# Patient Record
Sex: Female | Born: 1985 | Race: White | Hispanic: No | Marital: Married | Smoking: Never smoker
Health system: Southern US, Community
[De-identification: ages and names within clinical notes are randomized; demographics above are authoritative.]

## PROBLEM LIST (undated history)

## (undated) DIAGNOSIS — R002 Palpitations: Secondary | ICD-10-CM

## (undated) DIAGNOSIS — F419 Anxiety disorder, unspecified: Secondary | ICD-10-CM

## (undated) DIAGNOSIS — F319 Bipolar disorder, unspecified: Secondary | ICD-10-CM

## (undated) DIAGNOSIS — Z8489 Family history of other specified conditions: Secondary | ICD-10-CM

## (undated) DIAGNOSIS — M1990 Unspecified osteoarthritis, unspecified site: Secondary | ICD-10-CM

## (undated) DIAGNOSIS — Q249 Congenital malformation of heart, unspecified: Secondary | ICD-10-CM

## (undated) DIAGNOSIS — F909 Attention-deficit hyperactivity disorder, unspecified type: Secondary | ICD-10-CM

## (undated) DIAGNOSIS — I471 Supraventricular tachycardia: Secondary | ICD-10-CM

## (undated) DIAGNOSIS — R41840 Attention and concentration deficit: Secondary | ICD-10-CM

## (undated) DIAGNOSIS — G4730 Sleep apnea, unspecified: Secondary | ICD-10-CM

## (undated) DIAGNOSIS — R112 Nausea with vomiting, unspecified: Secondary | ICD-10-CM

## (undated) DIAGNOSIS — M5136 Other intervertebral disc degeneration, lumbar region: Secondary | ICD-10-CM

## (undated) DIAGNOSIS — F32A Depression, unspecified: Secondary | ICD-10-CM

## (undated) DIAGNOSIS — R21 Rash and other nonspecific skin eruption: Secondary | ICD-10-CM

## (undated) DIAGNOSIS — K219 Gastro-esophageal reflux disease without esophagitis: Secondary | ICD-10-CM

## (undated) DIAGNOSIS — F329 Major depressive disorder, single episode, unspecified: Secondary | ICD-10-CM

## (undated) DIAGNOSIS — T7840XA Allergy, unspecified, initial encounter: Secondary | ICD-10-CM

## (undated) DIAGNOSIS — M51369 Other intervertebral disc degeneration, lumbar region without mention of lumbar back pain or lower extremity pain: Secondary | ICD-10-CM

## (undated) HISTORY — PX: GASTRIC BYPASS: SHX52

## (undated) HISTORY — DX: Anxiety disorder, unspecified: F41.9

## (undated) HISTORY — DX: Sleep apnea, unspecified: G47.30

## (undated) HISTORY — DX: Bipolar disorder, unspecified: F31.9

## (undated) HISTORY — DX: Allergy, unspecified, initial encounter: T78.40XA

## (undated) HISTORY — DX: Attention-deficit hyperactivity disorder, unspecified type: F90.9

## (undated) HISTORY — DX: Palpitations: R00.2

## (undated) HISTORY — DX: Depression, unspecified: F32.A

## (undated) HISTORY — DX: Attention and concentration deficit: R41.840

## (undated) HISTORY — DX: Supraventricular tachycardia: I47.1

## (undated) HISTORY — DX: Gastro-esophageal reflux disease without esophagitis: K21.9

## (undated) HISTORY — DX: Rash and other nonspecific skin eruption: R21

## (undated) HISTORY — DX: Nausea with vomiting, unspecified: R11.2

## (undated) HISTORY — DX: Congenital malformation of heart, unspecified: Q24.9

## (undated) SURGERY — LAPAROSCOPIC CHOLECYSTECTOMY
Anesthesia: General

---
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
## 2006-11-10 ENCOUNTER — Emergency Department: Payer: Self-pay

## 2007-03-09 ENCOUNTER — Other Ambulatory Visit: Payer: Self-pay

## 2007-03-09 ENCOUNTER — Observation Stay: Payer: Self-pay

## 2007-04-27 ENCOUNTER — Observation Stay: Payer: Self-pay

## 2007-05-07 ENCOUNTER — Inpatient Hospital Stay: Payer: Self-pay | Admitting: Obstetrics & Gynecology

## 2007-05-14 ENCOUNTER — Emergency Department: Payer: Self-pay | Admitting: Emergency Medicine

## 2007-05-18 ENCOUNTER — Encounter: Payer: Self-pay | Admitting: Internal Medicine

## 2007-05-23 ENCOUNTER — Encounter: Payer: Self-pay | Admitting: Internal Medicine

## 2011-02-22 ENCOUNTER — Emergency Department: Payer: Self-pay | Admitting: Emergency Medicine

## 2011-03-06 ENCOUNTER — Emergency Department: Payer: Self-pay | Admitting: Emergency Medicine

## 2011-10-30 ENCOUNTER — Ambulatory Visit: Payer: Self-pay | Admitting: Internal Medicine

## 2013-10-10 DIAGNOSIS — G4730 Sleep apnea, unspecified: Secondary | ICD-10-CM

## 2013-10-10 HISTORY — DX: Sleep apnea, unspecified: G47.30

## 2015-04-22 LAB — HM PAP SMEAR: HM Pap smear: NEGATIVE

## 2016-08-12 ENCOUNTER — Encounter: Payer: Self-pay | Admitting: Family

## 2016-08-12 ENCOUNTER — Ambulatory Visit (INDEPENDENT_AMBULATORY_CARE_PROVIDER_SITE_OTHER): Payer: 59 | Admitting: Family

## 2016-08-12 VITALS — BP 110/86 | HR 106 | Temp 98.1°F | Ht 65.0 in | Wt 148.6 lb

## 2016-08-12 DIAGNOSIS — Z7689 Persons encountering health services in other specified circumstances: Secondary | ICD-10-CM | POA: Diagnosis not present

## 2016-08-12 DIAGNOSIS — F319 Bipolar disorder, unspecified: Secondary | ICD-10-CM

## 2016-08-12 DIAGNOSIS — F411 Generalized anxiety disorder: Secondary | ICD-10-CM

## 2016-08-12 HISTORY — DX: Bipolar disorder, unspecified: F31.9

## 2016-08-12 MED ORDER — HYDROXYZINE HCL 25 MG PO TABS: 25.0000 mg | ORAL_TABLET | Freq: Every evening | ORAL | 0 refills | Status: DC | PRN

## 2016-08-12 NOTE — Assessment & Plan Note (Signed)
Reviewed past medical, family, and social history. Requesting records.

## 2016-08-12 NOTE — Progress Notes (Signed)
Subjective:    Patient ID: Madeline White, female    DOB: October 13, 1985, 30 y.o.   MRN: [id number]  CC: Madeline Salkicole Braud is a 30 y.o. female who presents today to establish care.    HPI: Patient presents as new patient. Prior care had been with [place]. Requesting records.  Here today as urged by friends to come in for anxiety.   Anxiety- over past 6 months, worsening. "worrying about everybody,' Trouble sleeping, hard to fall asleep and also waking up. Takes melatonin, Sleep Aid, Tyelonol PM with no relief. No thoughts of hurting herself or anyone else. Thinks there is someone outside of her house approx 3 nights out of the week and husband has to remind her no one is out there. No hallucinations. Describes being more irritable lately and difficultly concentrating.   H/o SVT- had [facility] at [facility] several years ago and had been on metoprolol which she took her self off of. ' I feel like it's a anxiety attack.' Denies exertional chest pain or pressure, numbness or tingling radiating to left arm or jaw, palpitations, dizziness, frequent headaches, changes in vision, or shortness of breath.    H/o gastric bypass- Roux en Y.  no further follow up after surgery. Had been 286 lbs prior.     HISTORY:  Past Medical History:  Diagnosis Date  . SVT (supraventricular tachycardia) (HCC) 2006   had been on metoprolol 10mg  but stopped taking   No past surgical history on file. No family history on file.  Allergies: Penicillins No current outpatient prescriptions on file prior to visit.   No current facility-administered medications on file prior to visit.     Social History  Substance Use Topics  . Smoking status: Never Smoker  . Smokeless tobacco: Never Used  . Alcohol use Yes    Review of Systems  Constitutional: Negative for chills, fever and unexpected weight change.  HENT: Negative for congestion.   Cardiovascular: Negative for chest pain and  palpitations.  Gastrointestinal: Negative for nausea and vomiting.  Musculoskeletal: Negative for arthralgias and myalgias.  Skin: Negative for rash.  Neurological: Negative for headaches.  Hematological: Negative for adenopathy.  Psychiatric/Behavioral: Positive for decreased concentration and sleep disturbance. Negative for confusion, hallucinations and suicidal ideas. The patient is nervous/anxious.       Objective:    BP 110/86   Pulse (!) 106   Temp 98.1 F (36.7 C) (Oral)   Wt 148 lb 9.6 oz (67.4 kg)   SpO2 98%  BP Readings from Last 3 Encounters:  08/12/16 110/86   Wt Readings from Last 3 Encounters:  08/12/16 148 lb 9.6 oz (67.4 kg)    Physical Exam  Constitutional: She appears well-developed and well-nourished.  Eyes: Conjunctivae are normal.  Cardiovascular: Normal rate, regular rhythm, normal heart sounds and normal pulses.   Pulmonary/Chest: Effort normal and breath sounds normal. She has no wheezes. She has no rhonchi. She has no rales.  Neurological: She is alert.  Skin: Skin is warm and dry.  Psychiatric: She has a normal mood and affect. Her speech is normal and behavior is normal. Thought content normal.  Vitals reviewed.      Assessment & Plan:   Problem List Items Addressed This Visit      Other   GAD (generalized anxiety disorder) - Primary    Worsening anxiety. Concern that patient may have bipolar depression and advised patient to see psychiatry for further evaluation as she may need mood stabilizing  medication. We agreed that counseling in our office was an appropriate next step. For the short term, patient will trial atarax 25mg  PRN for sleep and anxiety. Follow up in 2-4 weeks.       Relevant Medications   hydrOXYzine (ATARAX/VISTARIL) 25 MG tablet   Other Relevant Orders   Ambulatory referral to Psychology   Ambulatory referral to Psychiatry   Encounter to establish care    Reviewed past medical, family, and social history. Requesting  records.           I am having Ms. Moisan start on hydrOXYzine. I am also having her maintain her cholecalciferol, aspirin, cyanocobalamin, and Biotin.   Meds ordered this encounter  Medications  . cholecalciferol (VITAMIN D) 1000 units tablet    Sig: Take 2,000 Units by mouth daily.  Marland Kitchen. aspirin 81 MG chewable tablet    Sig: Chew by mouth daily.  . cyanocobalamin 2000 MCG tablet    Sig: Take 2,500 mcg by mouth daily.  . Biotin 1000 MCG tablet    Sig: Take 1,000 mcg by mouth 3 (three) times daily.  . hydrOXYzine (ATARAX/VISTARIL) 25 MG tablet    Sig: Take 1 tablet (25 mg total) by mouth at bedtime as needed for anxiety (insomnia). Take 30 to 60 minutes before bedtime.    Dispense:  30 tablet    Refill:  0    Order Specific Question:   Supervising Provider    Answer:   Sherlene ShamsULLO, TERESA L [[id number]]    Return precautions given.   Risks, benefits, and alternatives of the medications and treatment plan prescribed today were discussed, and patient expressed understanding.   Education regarding symptom management and diagnosis given to patient on AVS.  Continue to follow with Rennie PlowmanMargaret Cash Duce, FNP for routine health maintenance.   Madeline SalkNicole Cotugno and I agreed with plan.   Rennie PlowmanMargaret Vennela Jutte, FNP

## 2016-08-12 NOTE — Patient Instructions (Signed)
Trial atarax start 25 mg 30 minutes prior to bedtime.  May increase to 50mg  at bedtime if needed.   Follow up in 2 weeks.  Generalized Anxiety Disorder Generalized anxiety disorder (GAD) is a mental disorder. It interferes with life functions, including relationships, work, and school. GAD is different from normal anxiety, which everyone experiences at some point in their lives in response to specific life events and activities. Normal anxiety actually helps us prepare for and get through these life events and activities. Normal anxiety goes away after the event or activity is over.  GAD causes anxiety that is not necessarily related to specific events or activities. It also causes excess anxiety in proportion to specific events or activities. The anxiety associated with GAD is also difficult to control. GAD can vary from mild to severe. People with severe GAD can have intense waves of anxiety with physical symptoms (panic attacks).  SYMPTOMS The anxiety and worry associated with GAD are difficult to control. This anxiety and worry are related to many life events and activities and also occur more days than not for 6 months or longer. People with GAD also have three or more of the following symptoms (one or more in children):  Restlessness.   Fatigue.  Difficulty concentrating.   Irritability.  Muscle tension.  Difficulty sleeping or unsatisfying sleep. DIAGNOSIS GAD is diagnosed through an assessment by your health care provider. Your health care provider will ask you questions aboutyour mood,physical symptoms, and events in your life. Your health care provider may ask you about your medical history and use of alcohol or drugs, including prescription medicines. Your health care provider may also do a physical exam and blood tests. Certain medical conditions and the use of certain substances can cause symptoms similar to those associated with GAD. Your health care provider may refer you  to a mental health specialist for further evaluation. TREATMENT The following therapies are usually used to treat GAD:   Medication. Antidepressant medication usually is prescribed for long-term daily control. Antianxiety medicines may be added in severe cases, especially when panic attacks occur.   Talk therapy (psychotherapy). Certain types of talk therapy can be helpful in treating GAD by providing support, education, and guidance. A form of talk therapy called cognitive behavioral therapy can teach you healthy ways to think about and react to daily life events and activities.  Stress managementtechniques. These include yoga, meditation, and exercise and can be very helpful when they are practiced regularly. A mental health specialist can help determine which treatment is best for you. Some people see improvement with one therapy. However, other people require a combination of therapies. This information is not intended to replace advice given to you by your health care provider. Make sure you discuss any questions you have with your health care provider. Document Released: 01/02/2013 Document Revised: 09/28/2014 Document Reviewed: 01/02/2013 Elsevier Interactive Patient Education  2017 [employer]

## 2016-08-12 NOTE — Progress Notes (Signed)
Pre visit review using our clinic review tool, if applicable. No additional management support is needed unless otherwise documented below in the visit note. 

## 2016-08-12 NOTE — Assessment & Plan Note (Signed)
Worsening anxiety. Concern that patient may have bipolar depression and advised patient to see psychiatry for further evaluation as she may need mood stabilizing medication. We agreed that counseling in our office was an appropriate next step. For the short term, patient will trial atarax 25mg  PRN for sleep and anxiety. Follow up in 2-4 weeks.

## 2016-08-17 ENCOUNTER — Encounter: Payer: Self-pay | Admitting: Family

## 2016-08-17 NOTE — Progress Notes (Signed)
Patient is scheduling appointment.  Anxiety is still about the same. Patient will try to get pulse then call us back.

## 2016-08-17 NOTE — Progress Notes (Signed)
Added FM HX to record.

## 2016-08-24 ENCOUNTER — Encounter: Payer: Self-pay | Admitting: Family

## 2016-08-24 ENCOUNTER — Ambulatory Visit (INDEPENDENT_AMBULATORY_CARE_PROVIDER_SITE_OTHER): Payer: 59 | Admitting: Family

## 2016-08-24 ENCOUNTER — Encounter (HOSPITAL_COMMUNITY): Payer: Self-pay

## 2016-08-24 VITALS — BP 134/80 | HR 98 | Temp 98.0°F | Ht 65.0 in | Wt 149.8 lb

## 2016-08-24 DIAGNOSIS — F411 Generalized anxiety disorder: Secondary | ICD-10-CM | POA: Diagnosis not present

## 2016-08-24 DIAGNOSIS — R002 Palpitations: Secondary | ICD-10-CM | POA: Insufficient documentation

## 2016-08-24 HISTORY — DX: Palpitations: R00.2

## 2016-08-24 MED ORDER — HYDROXYZINE HCL 50 MG PO TABS: 25.0000 mg | ORAL_TABLET | Freq: Every evening | ORAL | 0 refills | Status: DC | PRN

## 2016-08-24 NOTE — Patient Instructions (Signed)
Let me work on earlier referral to psychiatry.  Referral to cardiology for possible Holter monitor due to palpitations.   Let me know if you need anything and happy happy holidays!

## 2016-08-24 NOTE — Assessment & Plan Note (Signed)
Stable. Still have concern for bipolar depression.  Slight improvement with sleep on atarax. Appt with counseling. Awaiting appt with psychiatry. Working to expedite and send to [place] if sooner appointment.

## 2016-08-24 NOTE — Progress Notes (Signed)
Subjective:    Patient ID: Madeline White, female    DOB: Jul 16, 1986, 30 y.o.   MRN: [id number]  CC: Madeline Salkicole Brasington is a 30 y.o. female who presents today for follow up.   HPI: GAD- Last visit trial of atarax 50 mg. Working some nights and the others 'not so much'. Has days 'only wanted to clean for days' and then days of not wanting to 'do anything' with difficulty motivating.  Has tried Zoloft and didn't work. No thoughts of hurting herself or anyone else.  Counseling scheduled [facility] 10/08/16.   H/o occasional palpitations. H/o of being on propranolol due to SVT. Denies exertional chest pain or pressure, numbness or tingling radiating to left arm or jaw,  dizziness, frequent headaches, changes in vision, or shortness of breath.        HISTORY:  Past Medical History:  Diagnosis Date  . Cardiac arrhythmia due to congenital heart disease   . GERD (gastroesophageal reflux disease)   . SVT (supraventricular tachycardia) (HCC) 2006   had been on metoprolol 10mg  but stopped taking   Past Surgical History:  Procedure Laterality Date  . CESAREAN SECTION    . GASTRIC BYPASS    . GASTRIC BYPASS     Family History  Problem Relation Age of Onset  . Hyperlipidemia Mother   . Diabetes Mother   . Hyperlipidemia Father   . Heart disease Maternal Grandmother   . Diabetes Maternal Grandmother   . Heart disease Maternal Grandfather   . Diabetes Maternal Grandfather   . Heart disease Paternal Grandmother   . Diabetes Paternal Grandmother   . Alcohol abuse Paternal Grandfather   . Heart disease Paternal Grandfather   . Diabetes Paternal Grandfather     Allergies: Penicillins Current Outpatient Prescriptions on File Prior to Visit  Medication Sig Dispense Refill  . aspirin 81 MG chewable tablet Chew by mouth daily.    . Biotin 1000 MCG tablet Take 1,000 mcg by mouth 3 (three) times daily.    . cholecalciferol (VITAMIN D) 1000 units tablet Take 2,000 Units by mouth daily.    .  cyanocobalamin 2000 MCG tablet Take 2,500 mcg by mouth daily.     No current facility-administered medications on file prior to visit.     Social History  Substance Use Topics  . Smoking status: Never Smoker  . Smokeless tobacco: Never Used  . Alcohol use Yes    Review of Systems  Constitutional: Negative for chills and fever.  Respiratory: Negative for cough.   Cardiovascular: Positive for palpitations. Negative for chest pain.  Gastrointestinal: Negative for nausea and vomiting.  Psychiatric/Behavioral: Positive for sleep disturbance. Negative for suicidal ideas. The patient is nervous/anxious.       Objective:    BP 134/80   Pulse 98   Temp 98 F (36.7 C) (Oral)   Ht 5\' 5"  (1.651 m)   Wt 149 lb 12.8 oz (67.9 kg)   SpO2 99%   BMI 24.93 kg/m  BP Readings from Last 3 Encounters:  08/24/16 134/80  08/12/16 110/86   Wt Readings from Last 3 Encounters:  08/24/16 149 lb 12.8 oz (67.9 kg)  08/12/16 148 lb 9.6 oz (67.4 kg)    Physical Exam  Constitutional: She appears well-developed and well-nourished.  Eyes: Conjunctivae are normal.  Cardiovascular: Normal rate, regular rhythm, normal heart sounds and normal pulses.   Pulmonary/Chest: Effort normal and breath sounds normal. She has no wheezes. She has no rhonchi. She has no rales.  Neurological: She  is alert.  Skin: Skin is warm and dry.  Psychiatric: She has a normal mood and affect. Her speech is normal and behavior is normal. Thought content normal.  Vitals reviewed.      Assessment & Plan:   Problem List Items Addressed This Visit      Other   GAD (generalized anxiety disorder)    Stable. Still have concern for bipolar depression.  Slight improvement with sleep on atarax. Appt with counseling. Awaiting appt with psychiatry. Working to expedite and send to [place] if sooner appointment.       Relevant Medications   hydrOXYzine (ATARAX/VISTARIL) 50 MG tablet   Palpitations - Primary    HR 98. Due to  history, patient and I agreed another evaluation would be appropriate, conservative next step since it has been several years since last Holter monitor.  Reassured by absence of acute chest pain, SOB. Return precautions given.        Relevant Orders   Ambulatory referral to Cardiology       I have changed [name] hydrOXYzine. I am also having her maintain her cholecalciferol, aspirin, cyanocobalamin, and Biotin.   Meds ordered this encounter  Medications  . hydrOXYzine (ATARAX/VISTARIL) 50 MG tablet    Sig: Take 0.5 tablets (25 mg total) by mouth at bedtime as needed for anxiety (insomnia). Take 30 to 60 minutes before bedtime.    Dispense:  90 tablet    Refill:  0    Order Specific Question:   Supervising Provider    Answer:   Sherlene ShamsULLO, TERESA L [[id number]]    Return precautions given.   Risks, benefits, and alternatives of the medications and treatment plan prescribed today were discussed, and patient expressed understanding.   Education regarding symptom management and diagnosis given to patient on AVS.  Continue to follow with Rennie PlowmanMargaret Arnett, FNP for routine health maintenance.   Madeline SalkNicole Quirino and I agreed with plan.   Rennie PlowmanMargaret Arnett, FNP

## 2016-08-24 NOTE — Progress Notes (Signed)
Pre visit review using our clinic review tool, if applicable. No additional management support is needed unless otherwise documented below in the visit note. 

## 2016-08-24 NOTE — Assessment & Plan Note (Signed)
HR 98. Due to history, patient and I agreed another evaluation would be appropriate, conservative next step since it has been several years since last Holter monitor.  Reassured by absence of acute chest pain, SOB. Return precautions given.

## 2016-08-25 ENCOUNTER — Other Ambulatory Visit: Payer: Self-pay | Admitting: Family

## 2016-08-25 ENCOUNTER — Telehealth: Payer: Self-pay | Admitting: Family

## 2016-08-25 DIAGNOSIS — F411 Generalized anxiety disorder: Secondary | ICD-10-CM

## 2016-08-25 MED ORDER — HYDROXYZINE HCL 50 MG PO TABS: 50.0000 mg | ORAL_TABLET | Freq: Every evening | ORAL | 0 refills | Status: DC | PRN

## 2016-08-25 NOTE — Telephone Encounter (Signed)
Refilled correctly 

## 2016-08-25 NOTE — Progress Notes (Signed)
It was submitted to [facility] on 11/22. They do have to review the notes prior to scheduling an appointment. I will follow up with them.

## 2016-08-26 ENCOUNTER — Ambulatory Visit: Payer: Self-pay | Admitting: Cardiology

## 2016-08-26 ENCOUNTER — Encounter: Payer: Self-pay | Admitting: *Deleted

## 2016-08-26 NOTE — Progress Notes (Signed)
a 

## 2016-08-26 NOTE — Progress Notes (Signed)
Submitted referral to [facility].

## 2016-08-27 ENCOUNTER — Telehealth: Payer: Self-pay | Admitting: Family

## 2016-08-27 ENCOUNTER — Encounter: Payer: Self-pay | Admitting: Family

## 2016-08-27 NOTE — Telephone Encounter (Signed)
Patient saw mychart message per patient.

## 2016-08-27 NOTE — Telephone Encounter (Signed)
Sent below mychart to pt if she calls - I couldn't leave Vm    I would like for you to go to [facility] services anytime between 8am-3pm.  They have counseling and psychiatrists there who can see you right away.  They have walk ins on Monday, Wednesdays, and Fridays.  The address is [address] [place], [place] [address].  Phone number is [phone].   They are near [facility] and [facility] [address].   Please know that I am thinking about you.

## 2016-09-07 ENCOUNTER — Encounter: Payer: Self-pay | Admitting: Psychiatry

## 2016-09-07 ENCOUNTER — Ambulatory Visit (INDEPENDENT_AMBULATORY_CARE_PROVIDER_SITE_OTHER): Payer: 59 | Admitting: Psychiatry

## 2016-09-07 VITALS — BP 118/78 | HR 98 | Temp 97.6°F | Ht 63.0 in | Wt 153.0 lb

## 2016-09-07 DIAGNOSIS — F411 Generalized anxiety disorder: Secondary | ICD-10-CM

## 2016-09-07 DIAGNOSIS — F3130 Bipolar disorder, current episode depressed, mild or moderate severity, unspecified: Secondary | ICD-10-CM | POA: Diagnosis not present

## 2016-09-07 MED ORDER — QUETIAPINE FUMARATE 25 MG PO TABS: 25.0000 mg | ORAL_TABLET | Freq: Two times a day (BID) | ORAL | 2 refills | Status: DC
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
## 2016-09-15 ENCOUNTER — Encounter: Payer: Self-pay | Admitting: Family

## 2016-09-28 ENCOUNTER — Encounter: Payer: Self-pay | Admitting: Family

## 2016-09-28 ENCOUNTER — Telehealth: Payer: Self-pay | Admitting: Psychiatry

## 2016-09-28 NOTE — Telephone Encounter (Signed)
Met with Dr. Adele Schilder at the [facility] office who reviewed patient's current medications and advised patient to try changing her Seroquel 25 mg all to the bedtime dosage, so 50 mg at night instead of twice a day to see if would help with sedation.  Patient to call back if not effective.

## 2016-09-28 NOTE — Telephone Encounter (Signed)
called patient and gave advise. but pt states she just can not do the seroquel.  she wants to try something else. even just doing 25mg  just made her sleepy all day and ill tempered.

## 2016-09-29 ENCOUNTER — Other Ambulatory Visit (HOSPITAL_COMMUNITY): Payer: Self-pay | Admitting: Psychiatry

## 2016-09-29 NOTE — Telephone Encounter (Signed)
Met with Dr. Adele Schilder again who reviewed patient's reported problems with Seroquel.  Dr. Adele Schilder authorized patient to try Abilify 2 mg, one a day, #30 with no refills and to discontinue the Seroquel but wants patient seen within a month when Dr. Einar Grad returns so patient needs an appointment upon Dr. Josefa Half return.  Patient to contact staff back if any problems with medication prior to that new appointment time.

## 2016-09-29 NOTE — Telephone Encounter (Signed)
called pt gave instructions to stop seroquel and that I was going to call in orders for abilify 2mg  take

## 2016-09-29 NOTE — Telephone Encounter (Signed)
called in rx for ability 2mg  #30 no additional refills.

## 2016-10-01 ENCOUNTER — Ambulatory Visit (INDEPENDENT_AMBULATORY_CARE_PROVIDER_SITE_OTHER): Payer: 59 | Admitting: Family

## 2016-10-01 ENCOUNTER — Encounter: Payer: Self-pay | Admitting: Family

## 2016-10-01 ENCOUNTER — Telehealth: Payer: Self-pay | Admitting: Family

## 2016-10-01 VITALS — BP 106/70 | HR 114 | Temp 98.3°F | Ht 65.0 in | Wt 151.2 lb

## 2016-10-01 DIAGNOSIS — M545 Low back pain, unspecified: Secondary | ICD-10-CM | POA: Insufficient documentation

## 2016-10-01 DIAGNOSIS — R002 Palpitations: Secondary | ICD-10-CM | POA: Diagnosis not present

## 2016-10-01 DIAGNOSIS — R21 Rash and other nonspecific skin eruption: Secondary | ICD-10-CM

## 2016-10-01 DIAGNOSIS — F3131 Bipolar disorder, current episode depressed, mild: Secondary | ICD-10-CM | POA: Diagnosis not present

## 2016-10-01 HISTORY — DX: Rash and other nonspecific skin eruption: R21

## 2016-10-01 MED ORDER — METOPROLOL SUCCINATE ER 25 MG PO TB24: 12.5000 mg | ORAL_TABLET | Freq: Every day | ORAL | 0 refills | Status: DC

## 2016-10-01 MED ORDER — CLOTRIMAZOLE 1 % EX CREA: 1.0000 "application " | TOPICAL_CREAM | Freq: Two times a day (BID) | CUTANEOUS | 1 refills | Status: DC

## 2016-10-01 MED ORDER — DICLOFENAC SODIUM 1 % TD GEL: 4.0000 g | Freq: Four times a day (QID) | TRANSDERMAL | 3 refills | Status: DC

## 2016-10-01 MED ORDER — CYCLOBENZAPRINE HCL 10 MG PO TABS: 10.0000 mg | ORAL_TABLET | Freq: Every evening | ORAL | 0 refills | Status: DC | PRN

## 2016-10-01 NOTE — Patient Instructions (Signed)
Trial flexeril at bedtime  voltaren gel for pain  Stop tramadol  Clotrimazole for rash  Exercises below.  walking program.   Over-the-counter medications you may try for back pain include:   ThermaCare patches   Capsaicin cream   Icy hot  Home exercises as we discussed below/handout.  If conservative treatment doesn't yield results, we will consider physical therapy, consult to Sports Medicine/Orthopedics for further evaluation, and imaging.   If there is no improvement in your symptoms, or if there is any worsening of symptoms, or if you have any additional concerns, please return for re-evaluation; or, if we are closed, consider going to the Emergency Room for evaluation if symptoms urgent.    Spondylolysis Rehab Ask your health care provider which exercises are safe for you. Do exercises exactly as told by your health care provider and adjust them as directed. It is normal to feel mild stretching, pulling, tightness, or discomfort as you do these exercises, but you should stop right away if you feel sudden pain or your pain gets worse. Do not begin these exercises until told by your health care provider. Stretching and range of motion exercises These exercises warm up your muscles and joints and improve the movement and flexibility of your hips and your back. These exercises may also help to relieve pain, numbness, and tingling. Exercise A: Single knee to chest 1. Lie on your back on a firm surface with both legs straight. 2. Bend one of your knees. Use your hands to move your knee up toward your chest until you feel a gentle stretch in your lower back and buttock.  Hold your leg in this position by holding onto the front of your knee.  Keep your other leg as straight as possible. 3. Hold for __________ seconds. 4. Slowly return to the starting position. 5. Repeat this exercise with your other leg. Repeat __________ times. Complete this exercise __________ times a  day. Exercise B: Hamstring stretch, supine 1. Lie on your back. 2. Hold both ends of a belt or towel as you loop it over the ball of one of your feet. The ball of your foot is on the walking surface, right under your toes. 3. Straighten your knee and slowly pull on the belt to raise your leg.  Do not let your knee bend while you do this.  Keep your other leg flat on the floor.  Raise the leg until you feel a gentle stretch in the back of your knee or thigh. 4. Hold for __________ seconds. 5. If told by your health care provider, repeat this exercise with your other leg. Repeat __________ times. Complete this exercise __________ times a day. Strengthening exercises These exercises build strength and endurance in your back. Endurance is the ability to use your muscles for a long time, even after they get tired. Exercise C: Pelvic tilt 1. Lie on your back on a firm bed or the floor. Bend your knees and keep your feet flat. 2. Tense your abdominal muscles. Tip your pelvis up toward the ceiling and flatten your lower back into the floor.  To help with this exercise, you may place a small towel under your lower back and try to push your back into the towel. 3. Hold for __________ seconds. 4. Let your muscles relax completely before you repeat this exercise. Repeat __________ times. Complete this exercise __________ times a day. Exercise D: Abdominal crunch 1. Lie on your back on a firm surface. Bend your knees and  keep your feet flat. Cross your arms over your chest. 2. Tuck your chin down toward your chest, without bending your neck. 3. Use your abdominal muscles to lift your upper body off of the ground, straight up into the air.  Try to lift yourself until your shoulder blades are off the ground. You may need to work up to this.  Keep your lower back on the ground while you crunch upward.  Do not hold your breath. 4. Slowly lower yourself down. Keep your abdominal muscles tense until  you are back to the starting position. Repeat __________ times. Complete this exercise __________ times a day. Exercise E: Alternating arm and leg raises 1. Get on your hands and knees on a firm surface. If you are on a hard floor, you may want to use padding to cushion your knees, such as an exercise mat. 2. Line up your arms and legs. Your hands should be below your shoulders, and your knees should be below your hips. 3. Lift your left leg behind you. At the same time, raise your right arm and straighten it in front of you.  Do not lift your leg higher than your hip.  Do not lift your arm higher than your shoulder.  Keep your abdominal and back muscles tight.  Keep your hips facing the ground.  Do not arch your back.  Keep your balance carefully, and do not hold your breath. 4. Hold for __________ seconds. 5. Slowly return to the starting position and repeat with your right leg and your left arm. Repeat __________ times. Complete this exercise __________ times a day. Posture and body mechanics Body mechanics refers to the movements and positions of your body while you do your daily activities. Posture is part of body mechanics. Good posture and healthy body mechanics can help to relieve stress in your body's tissues and joints. Good posture means that your spine is in its natural S-curve position (your spine is neutral), your shoulders are pulled back slightly, and your head is not tipped forward. The following are general guidelines for applying improved posture and body mechanics to your everyday activities. Standing   When standing, keep your spine neutral and your feet about hip-width apart. Keep a slight bend in your knees. Your ears, shoulders, and hips should line up with each other.  When you do a task in which you stand in one place for a long time, place one foot up on a stable object that is 2-4 inches (5-10 cm) high, such as a footstool. This helps keep your spine  neutral. Sitting  When sitting, keep your spine neutral and keep your feet flat on the floor. Use a footrest, if necessary, and keep your thighs parallel to the floor. Avoid rounding your shoulders, and avoid tilting your head forward.  When working at a desk or a computer, keep your desk at a height where your hands are slightly lower than your elbows. Slide your chair under your desk so you are close enough to maintain good posture.  When working at a computer, place your monitor at a height where you are looking straight ahead and you do not have to tilt your head forward or downward to look at the screen. Resting   When lying down and resting, avoid positions that are most painful for you.  If you have pain with activities such as sitting, bending, stooping, or squatting (flexion-based activities), lie in a position in which your body does not bend very much.  For example, avoid curling up on your side with your arms and knees near your chest (fetal position).  If you have pain with activities such as standing for a long time or reaching with your arms (extension-based activities), lie with your spine in a neutral position and bend your knees slightly. Try the following positions:  Lying on your side with a pillow between your knees.  Lying on your back with a pillow under your knees. Lifting   When lifting objects, keep your feet at least shoulder-width apart and tighten your abdominal muscles.  Bend your knees and hips and keep your spine neutral. It is important to lift using the strength of your legs, not your back. Do not lock your knees straight out.  Always ask for help to lift heavy or awkward objects. This information is not intended to replace advice given to you by your health care provider. Make sure you discuss any questions you have with your health care provider. Document Released: 09/07/2005 Document Revised: 05/14/2016 Document Reviewed: 06/18/2015 Elsevier  Interactive Patient Education  2017 [employer].

## 2016-10-01 NOTE — Progress Notes (Signed)
Pre visit review using our clinic review tool, if applicable. No additional management support is needed unless otherwise documented below in the visit note. 

## 2016-10-01 NOTE — Assessment & Plan Note (Signed)
Stable. Continues to follow with behavioral health.

## 2016-10-01 NOTE — Assessment & Plan Note (Signed)
No chest pain, shortness of breath. Chronic according to patient. I re-advised patient that I would like her to see cardiology for this history as she may need additional evaluation, echocardiogram, Holter monitor. In the interim, I went ahead and started metoprolol. Follow up in one month.

## 2016-10-01 NOTE — Assessment & Plan Note (Signed)
Symptoms consistent with candidiasis. Trial of antifungal.

## 2016-10-01 NOTE — Assessment & Plan Note (Signed)
Symptoms consistent with muscle strain, spasm. Treat short course of Flexeril at bedtime. Voltaren gel. Advised patient to stop tramadol which she is taking of her husband.I spent a great deal of time discussing the importance of exercise. I do not see tramadol as a part of medical therapy and emphasized this with patient.

## 2016-10-01 NOTE — Telephone Encounter (Signed)
Call pt and she how she is on on the metoprolol  Has she made an appt with cardiology yet?  What is her HR today?

## 2016-10-01 NOTE — Progress Notes (Signed)
Subjective:    Patient ID: Madeline White, female    DOB: 04-Sep-1986, 31 y.o.   MRN: [id number]  CC: Madeline White is a 31 y.o. female who presents today for an acute visit.    HPI: CC: low midline back pain 'forever'. A week ago had a flare and thought it was how she slept. 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 Thought from sleep or cleaning around the house. No numbness, tingling in legs, no LE weakness. Tried heating pad with some relief.   Taking husband's tramadol BID which helped  Recently changed from seroquel due to sleepiness, irritability to abilify. Follow up in one month.   Had appt with cardio however patient canceled it. She's been on metoprolol the past for heart palpitations and "not sure I shared him off of it".Denies exertional chest pain or pressure, numbness or tingling radiating to left arm or jaw, palpitations, dizziness, frequent headaches, changes in vision, or shortness of breath.   She also complains of red itchy rash inside her belly button that is foul-smelling. She has tried steroid cream with no resolved. No fever, discharge.       HISTORY:  Past Medical History:  Diagnosis Date  . Cardiac arrhythmia due to congenital heart disease   . GERD (gastroesophageal reflux disease)   . SVT (supraventricular tachycardia) (HCC) 2006   had been on metoprolol 10mg  but stopped taking   Past Surgical History:  Procedure Laterality Date  . CESAREAN SECTION    . GASTRIC BYPASS    . GASTRIC BYPASS     Family History  Problem Relation Age of Onset  . Hyperlipidemia Mother   . Diabetes Mother   . Hyperlipidemia Father   . Heart disease Maternal Grandmother   . Diabetes Maternal Grandmother   . Heart disease Maternal Grandfather   . Diabetes Maternal Grandfather   . Heart disease Paternal Grandmother   . Diabetes Paternal Grandmother   . Alcohol abuse Paternal Grandfather   . Heart disease Paternal Grandfather   . Diabetes Paternal Grandfather      Allergies: Penicillins Current Outpatient Prescriptions on File Prior to Visit  Medication Sig Dispense Refill  . aspirin 81 MG chewable tablet Chew by mouth daily.    . Biotin 1000 MCG tablet Take 1,000 mcg by mouth 3 (three) times daily.    . cholecalciferol (VITAMIN D) 1000 units tablet Take 2,000 Units by mouth daily.    . cyanocobalamin 2000 MCG tablet Take 2,500 mcg by mouth daily.     No current facility-administered medications on file prior to visit.     Social History  Substance Use Topics  . Smoking status: Never Smoker  . Smokeless tobacco: Never Used  . Alcohol use No    Review of Systems  Constitutional: Negative for chills and fever.  Respiratory: Negative for cough, shortness of breath and wheezing.   Cardiovascular: Positive for palpitations. Negative for chest pain.  Gastrointestinal: Negative for nausea and vomiting.  Skin: Positive for rash.  Psychiatric/Behavioral: The patient is nervous/anxious.       Objective:    BP 106/70   Pulse (!) 113   Temp 98.3 F (36.8 C) (Oral)   Ht 5\' 5"  (1.651 m)   Wt 151 lb 3.2 oz (68.6 kg)   SpO2 98%   BMI 25.16 kg/m    Physical Exam  Constitutional: She appears well-developed and well-nourished.  Eyes: Conjunctivae are normal.  Cardiovascular: Regular rhythm, normal heart sounds and normal pulses.  Tachycardia present.  No murmur heard. Pulmonary/Chest: Effort normal and breath sounds normal. She has no wheezes. She has no rhonchi. She has no rales.  Musculoskeletal:       Lumbar back: She exhibits normal range of motion, no tenderness, no bony tenderness, no swelling, no edema, no pain and no spasm.       Back:       Arms: Full range of motion with flexion, tension, lateral side bends. No bony tenderness. No pain, numbness, tingling elicited with single leg raise bilaterally. Pain as described by patient noted on diagram  Neurological: She is alert. She has normal strength. No sensory deficit.  Reflex  Scores:      Patellar reflexes are 2+ on the right side and 2+ on the left side. Sensation and strength intact bilateral lower extremities.  Skin: Skin is warm and dry. Rash noted.  Erythematous area noted inside umbilicus. No discharge.  Psychiatric: She has a normal mood and affect. Her speech is normal and behavior is normal. Thought content normal.  Vitals reviewed.      Assessment & Plan:   Problem List Items Addressed This Visit      Musculoskeletal and Integument   Rash and nonspecific skin eruption - Primary    Symptoms consistent with candidiasis. Trial of antifungal.      Relevant Medications   clotrimazole (LOTRIMIN) 1 % cream     Other   Bipolar affective (HCC)    Stable. Continues to follow with behavioral health.      Palpitations    No chest pain, shortness of breath. Chronic according to patient. I re-advised patient that I would like her to see cardiology for this history as she may need additional evaluation, echocardiogram, Holter monitor. In the interim, I went ahead and started metoprolol. Follow up in one month.       Relevant Medications   metoprolol succinate (TOPROL XL) 25 MG 24 hr tablet   Midline low back pain without sciatica    Symptoms consistent with muscle strain, spasm. Treat short course of Flexeril at bedtime. Voltaren gel. Advised patient to stop tramadol which she is taking of her husband.I spent a great deal of time discussing the importance of exercise. I do not see tramadol as a part of medical therapy and emphasized this with patient.       Relevant Medications   cyclobenzaprine (FLEXERIL) 10 MG tablet   diclofenac sodium (VOLTAREN) 1 % GEL        I have discontinued Ms. Hlavac QUEtiapine. I am also having her start on metoprolol succinate, clotrimazole, cyclobenzaprine, and diclofenac sodium. Additionally, I am having her maintain her ARIPiprazole, cholecalciferol, aspirin, cyanocobalamin, and Biotin.   Meds ordered this  encounter  Medications  . ARIPiprazole (ABILIFY) 2 MG tablet    Sig: Take 2 mg by mouth daily.  . metoprolol succinate (TOPROL XL) 25 MG 24 hr tablet    Sig: Take 0.5 tablets (12.5 mg total) by mouth daily.    Dispense:  90 tablet    Refill:  0    Order Specific Question:   Supervising Provider    Answer:   Duncan Dull L [[id number]]  . clotrimazole (LOTRIMIN) 1 % cream    Sig: Apply 1 application topically 2 (two) times daily.    Dispense:  30 g    Refill:  1    Order Specific Question:   Supervising Provider    Answer:   Darrick Huntsman, TERESA L [[id number]]  . cyclobenzaprine (FLEXERIL) 10 MG  tablet    Sig: Take 1 tablet (10 mg total) by mouth at bedtime as needed for muscle spasms.    Dispense:  14 tablet    Refill:  0    Order Specific Question:   Supervising Provider    Answer:   Duncan Dull L [[id number]]  . diclofenac sodium (VOLTAREN) 1 % GEL    Sig: Apply 4 g topically 4 (four) times daily.    Dispense:  1 Tube    Refill:  3    Order Specific Question:   Supervising Provider    Answer:   Sherlene Shams [[id number]]    Return precautions given.   Risks, benefits, and alternatives of the medications and treatment plan prescribed today were discussed, and patient expressed understanding.   Education regarding symptom management and diagnosis given to patient on AVS.  Continue to follow with Rennie Plowman, FNP for routine health maintenance.   Madeline Salk and I agreed with plan.   Rennie Plowman, FNP

## 2016-10-05 ENCOUNTER — Telehealth: Payer: Self-pay

## 2016-10-05 ENCOUNTER — Encounter: Payer: Self-pay | Admitting: Family

## 2016-10-05 NOTE — Telephone Encounter (Signed)
pt called states she can not take the abilify either it is making her sleepy, ill and easy to anger.  [place] abilify. since dr. Daleen Boravi was out of the country.

## 2016-10-05 NOTE — Telephone Encounter (Signed)
Spoken to patient unsure of heart rate.  She feels no difference on the metoprolol.  She has cardio appointment tomorrow. Also patient has scheduled FU with margaret this week.

## 2016-10-06 ENCOUNTER — Encounter: Payer: Self-pay | Admitting: Family

## 2016-10-06 ENCOUNTER — Ambulatory Visit (INDEPENDENT_AMBULATORY_CARE_PROVIDER_SITE_OTHER): Payer: 59 | Admitting: Family

## 2016-10-06 ENCOUNTER — Telehealth: Payer: Self-pay

## 2016-10-06 ENCOUNTER — Telehealth (HOSPITAL_COMMUNITY): Payer: Self-pay

## 2016-10-06 ENCOUNTER — Encounter: Payer: Self-pay | Admitting: Cardiology

## 2016-10-06 ENCOUNTER — Ambulatory Visit (INDEPENDENT_AMBULATORY_CARE_PROVIDER_SITE_OTHER): Payer: 59 | Admitting: Cardiology

## 2016-10-06 VITALS — BP 120/75 | HR 97 | Temp 98.0°F | Ht 65.0 in | Wt 151.6 lb

## 2016-10-06 VITALS — BP 120/80 | HR 75 | Ht 66.0 in | Wt 151.0 lb

## 2016-10-06 DIAGNOSIS — Z8679 Personal history of other diseases of the circulatory system: Secondary | ICD-10-CM

## 2016-10-06 DIAGNOSIS — M545 Low back pain, unspecified: Secondary | ICD-10-CM

## 2016-10-06 DIAGNOSIS — R002 Palpitations: Secondary | ICD-10-CM | POA: Diagnosis not present

## 2016-10-06 DIAGNOSIS — F3131 Bipolar disorder, current episode depressed, mild: Secondary | ICD-10-CM | POA: Diagnosis not present

## 2016-10-06 MED ORDER — DULOXETINE HCL 30 MG PO CPEP: ORAL_CAPSULE | ORAL | 3 refills | Status: DC

## 2016-10-06 MED ORDER — TRAMADOL HCL 50 MG PO TABS: 50.0000 mg | ORAL_TABLET | Freq: Every day | ORAL | 0 refills | Status: DC | PRN

## 2016-10-06 MED ORDER — TRAMADOL HCL 50 MG PO TABS: 25.0000 mg | ORAL_TABLET | Freq: Every day | ORAL | 0 refills | Status: DC | PRN

## 2016-10-06 NOTE — Patient Instructions (Signed)
Trial of cymbalta for anxiety and pain.  Watch your blood pressure and heart rate on cymbalta.    Consider counseling for irritability.    If there is no improvement in your symptoms, or if there is any worsening of symptoms, or if you have any additional concerns, please return for re-evaluation; or, if we are closed, consider going to the Emergency Room for evaluation if symptoms urgent.

## 2016-10-06 NOTE — Assessment & Plan Note (Signed)
Pending appointment with [facility] since not on abilify at this time. Advised counseling and patient wil consider at a later date. Advised that patient needs to continue to follow with [facility] as she will need mood stabilizing agent, not antidepressant.

## 2016-10-06 NOTE — Progress Notes (Signed)
Subjective:    Patient ID: Madeline White, female    DOB: [birth year], 31 y.o.   MRN: [id number]  CC: Madeline White is a 31 y.o. female who presents today for follow up.   HPI: Follow up for stress, irritably more so in the past 2-3 weeks.  'Everything puts me over the edge.' Has not gotten physical with daughter, son, or husband. Deep breathing helps.   Had been taking abilify but making her 'sleepy.'  Cannot tell a difference in mood when takes at night.   Taking flexeril at night for knee and back pain with some relief. Had been Horticulturist, commercial. Feels she could exercise if pain was better controlled.       HISTORY:  Past Medical History:  Diagnosis Date  . Cardiac arrhythmia due to congenital heart disease   . GERD (gastroesophageal reflux disease)   . SVT (supraventricular tachycardia) (HCC) 2006   had been on metoprolol 10mg  but stopped taking   Past Surgical History:  Procedure Laterality Date  . CESAREAN SECTION    . GASTRIC BYPASS    . GASTRIC BYPASS     Family History  Problem Relation Age of Onset  . Hyperlipidemia Mother   . Diabetes Mother   . Hyperlipidemia Father   . Heart disease Maternal Grandmother   . Diabetes Maternal Grandmother   . Heart disease Maternal Grandfather   . Diabetes Maternal Grandfather   . Heart disease Paternal Grandmother   . Diabetes Paternal Grandmother   . Alcohol abuse Paternal Grandfather   . Heart disease Paternal Grandfather   . Diabetes Paternal Grandfather     Allergies: Penicillins Current Outpatient Prescriptions on File Prior to Visit  Medication Sig Dispense Refill  . aspirin 81 MG chewable tablet Chew by mouth daily.    . Biotin 1000 MCG tablet Take 1,000 mcg by mouth 3 (three) times daily.    . cholecalciferol (VITAMIN D) 1000 units tablet Take 2,000 Units by mouth daily.    . clotrimazole (LOTRIMIN) 1 % cream Apply 1 application topically 2 (two) times daily. 30 g 1  . cyanocobalamin 2000 MCG tablet Take 2,500 mcg  by mouth daily.    . cyclobenzaprine (FLEXERIL) 10 MG tablet Take 1 tablet (10 mg total) by mouth at bedtime as needed for muscle spasms. 14 tablet 0  . diclofenac sodium (VOLTAREN) 1 % GEL Apply 4 g topically 4 (four) times daily. 1 Tube 3  . metoprolol succinate (TOPROL XL) 25 MG 24 hr tablet Take 0.5 tablets (12.5 mg total) by mouth daily. 90 tablet 0   No current facility-administered medications on file prior to visit.     Social History  Substance Use Topics  . Smoking status: Never Smoker  . Smokeless tobacco: Never Used  . Alcohol use No    Review of Systems  Constitutional: Negative for chills and fever.  Respiratory: Negative for cough.   Cardiovascular: Negative for chest pain and palpitations.  Gastrointestinal: Negative for nausea and vomiting.  Musculoskeletal: Positive for back pain.  Psychiatric/Behavioral: Positive for agitation. Negative for sleep disturbance. The patient is nervous/anxious.       Objective:    BP 120/75   Pulse 97   Temp 98 F (36.7 C) (Oral)   Ht 5\' 5"  (1.651 m)   Wt 151 lb 9.6 oz (68.8 kg)   SpO2 98%   BMI 25.23 kg/m  BP Readings from Last 3 Encounters:  10/06/16 120/75  10/01/16 106/70  08/24/16 134/80   Wt Readings  from Last 3 Encounters:  10/06/16 151 lb 9.6 oz (68.8 kg)  10/01/16 151 lb 3.2 oz (68.6 kg)  08/24/16 149 lb 12.8 oz (67.9 kg)    Physical Exam  Constitutional: She appears well-developed and well-nourished.  Eyes: Conjunctivae are normal.  Cardiovascular: Normal rate, regular rhythm, normal heart sounds and normal pulses.   Pulmonary/Chest: Effort normal and breath sounds normal. She has no wheezes. She has no rhonchi. She has no rales.  Neurological: She is alert.  Skin: Skin is warm and dry.  Psychiatric: She has a normal mood and affect. Her speech is normal and behavior is normal. Thought content normal.  Vitals reviewed.      Assessment & Plan:   Problem List Items Addressed This Visit      Other    Bipolar affective (HCC) - Primary    Pending appointment with [facility] since not on abilify at this time. Advised counseling and patient wil consider at a later date. Advised that patient needs to continue to follow with [facility] as she will need mood stabilizing agent, not antidepressant.       Midline low back pain without sciatica    Chronic. Patient not a candidate for NSAIDs ( h/o gastric bypass) nor cymbalta ( h/o bipolar). I looked up patient on [place] Controlled Substances Reporting System and saw no activity that raised concern of inappropriate use. This will not be a daily medication. Spoke at length about the importance of exercise and PT in the future.                         Relevant Medications   traMADol (ULTRAM) 50 MG tablet       I have discontinued [name] ARIPiprazole, DULoxetine, and traMADol. I am also having her start on traMADol. Additionally, I am having her maintain her metoprolol succinate, clotrimazole, cholecalciferol, aspirin, cyanocobalamin, Biotin, cyclobenzaprine, and diclofenac sodium.   Meds ordered this encounter  Medications  . DISCONTD: DULoxetine (CYMBALTA) 30 MG capsule    Sig: Take one 30 mg tablet by mouth once a day for the first week. Then increase to two 30 mg tablets ( total 60mg ) by mouth once daily.    Dispense:  60 capsule    Refill:  3    Order Specific Question:   Supervising Provider    Answer:   Duncan DullULLO, TERESA L [[id number]]  . DISCONTD: traMADol (ULTRAM) 50 MG tablet    Sig: Take 1 tablet (50 mg total) by mouth daily as needed.    Dispense:  30 tablet    Refill:  0    Order Specific Question:   Supervising Provider    Answer:   Duncan DullULLO, TERESA L [[id number]]  . traMADol (ULTRAM) 50 MG tablet    Sig: Take 0.5 tablets (25 mg total) by mouth daily as needed.    Dispense:  20 tablet    Refill:  0    Order Specific Question:   Supervising Provider    Answer:   Sherlene ShamsULLO, TERESA L [[id number]]    Return precautions given.   Risks, benefits, and alternatives of  the medications and treatment plan prescribed today were discussed, and patient expressed understanding.   Education regarding symptom management and diagnosis given to patient on AVS.  Continue to follow with Rennie PlowmanMargaret Arnett, FNP for routine health maintenance.   Luanna SalkNicole Teat and I agreed with plan.   Rennie PlowmanMargaret Arnett, FNP  Note: called pharmacy and left message to cancel cymbalta

## 2016-10-06 NOTE — Progress Notes (Signed)
Cardiology Office Note   Date:  10/06/2016   ID:  Madeline White, DOB December 10, 1985, MRN [id number]  Referring Doctor:  Rennie Plowman, FNP   Cardiologist:   Almond Lint, MD   Reason for consultation:  Chief Complaint  Patient presents with  . other    New Patient. Referred by Dr.Arnett for palpitations. Pt states she is doing well. Reviewed meds with pt verbally.      History of Present Illness: Madeline White is a 31 y.o. female who presents for Evaluation for palpitations  Patient reports that she was diagnosed with SVT when she was much younger. She recalls being on medication, likely Toprol, 50 mg a day. She did not go for any procedure. Somehow, that medication was discontinued. Recently, the last several months, she started having episodes of palpitations where she would feel her heart racing. Moderate in severity, not as severe as her SVT episodes when she was much younger. No associated presyncope or syncope. No chest pain or shortness of breath. Symptoms will last a few minutes at a time, eventually resolving spontaneously. She was then restarted on metoprolol, she does not feel it helped with her symptoms. She is in a much lower dose now compared to what she was taking when she was much younger.  Currently, no chest pain, fever, cough, colds, abdominal pain, loss of consciousness, edema.   ROS:  Please see the history of present illness. Aside from mentioned under HPI, all other systems are reviewed and negative.     Past Medical History:  Diagnosis Date  . Anxiety   . Bipolar affective disorder (HCC)   . Cardiac arrhythmia due to congenital heart disease   . GERD (gastroesophageal reflux disease)   . SVT (supraventricular tachycardia) (HCC) 2006   had been on metoprolol 10mg  but stopped taking    Past Surgical History:  Procedure Laterality Date  . CESAREAN SECTION    . GASTRIC BYPASS    . GASTRIC BYPASS       reports that she has never smoked. She has  never used smokeless tobacco. She reports that she does not drink alcohol or use drugs.   family history includes Alcohol abuse in her paternal grandfather; Diabetes in her maternal grandfather, maternal grandmother, mother, paternal grandfather, and paternal grandmother; Heart disease in her maternal grandfather, maternal grandmother, paternal grandfather, and paternal grandmother; Hyperlipidemia in her father and mother.   Outpatient Medications Prior to Visit  Medication Sig Dispense Refill  . aspirin 81 MG chewable tablet Chew by mouth daily.    . Biotin 1000 MCG tablet Take 1,000 mcg by mouth 3 (three) times daily.    . cholecalciferol (VITAMIN D) 1000 units tablet Take 2,000 Units by mouth daily.    . clotrimazole (LOTRIMIN) 1 % cream Apply 1 application topically 2 (two) times daily. 30 g 1  . cyanocobalamin 2000 MCG tablet Take 2,500 mcg by mouth daily.    . cyclobenzaprine (FLEXERIL) 10 MG tablet Take 1 tablet (10 mg total) by mouth at bedtime as needed for muscle spasms. 14 tablet 0  . diclofenac sodium (VOLTAREN) 1 % GEL Apply 4 g topically 4 (four) times daily. 1 Tube 3  . metoprolol succinate (TOPROL XL) 25 MG 24 hr tablet Take 0.5 tablets (12.5 mg total) by mouth daily. 90 tablet 0  . traMADol (ULTRAM) 50 MG tablet Take 0.5 tablets (25 mg total) by mouth daily as needed. 20 tablet 0   No facility-administered medications prior to visit.  Allergies: Penicillins    PHYSICAL EXAM: VS:  BP 120/80 (BP Location: Right Arm, Patient Position: Sitting, Cuff Size: Normal)   Pulse 75   Ht 5\' 6"  (1.676 m)   Wt 151 lb (68.5 kg)   BMI 24.37 kg/m  , Body mass index is 24.37 kg/m. Wt Readings from Last 3 Encounters:  10/06/16 151 lb (68.5 kg)  10/06/16 151 lb 9.6 oz (68.8 kg)  10/01/16 151 lb 3.2 oz (68.6 kg)    GENERAL:  well developed, well nourished, not in acute distress HEENT: normocephalic, pink conjunctivae, anicteric sclerae, no xanthelasma, normal dentition, oropharynx  clear NECK:  no neck vein engorgement, JVP normal, no hepatojugular reflux, carotid upstroke brisk and symmetric, no bruit, no thyromegaly, no lymphadenopathy LUNGS:  good respiratory effort, clear to auscultation bilaterally CV:  PMI not displaced, no thrills, no lifts, S1 and S2 within normal limits, no palpable S3 or S4, no murmurs, no rubs, no gallops ABD:  Soft, nontender, nondistended, normoactive bowel sounds, no abdominal aortic bruit, no hepatomegaly, no splenomegaly MS: nontender back, no kyphosis, no scoliosis, no joint deformities EXT:  2+ DP/PT pulses, no edema, no varicosities, no cyanosis, no clubbing SKIN: warm, nondiaphoretic, normal turgor, no ulcers NEUROPSYCH: alert, oriented to person, place, and time, sensory/motor grossly intact, normal mood, appropriate affect  Recent Labs: No results found for requested labs within last 8760 hours.   Lipid Panel No results found for: CHOL, TRIG, HDL, CHOLHDL, VLDL, LDLCALC, LDLDIRECT   Other studies Reviewed:  EKG:  The ekg from 10/06/2016 was personally reviewed by me and it revealed sinus rhythm, 75 BPM.  Additional studies/ records that were reviewed personally reviewed by me today include: None available   ASSESSMENT AND PLAN:  Palpitations History of SVT Recommend echocardiogram and 24-hour Holter monitor for now. Depending on what we see on the Holter, may need to up titrate medication.  Current medicines are reviewed at length with the patient today.  The patient does not have concerns regarding medicines.  Labs/ tests ordered today include:  Orders Placed This Encounter  Procedures  . Holter monitor - 24 hour  . EKG 12-Lead  . ECHOCARDIOGRAM COMPLETE    I had a lengthy and detailed discussion with the patient regarding diagnoses, prognosis, diagnostic options, treatment options , and side effects of medications.   I counseled the patient on importance of lifestyle modification including heart healthy diet,  regular physical activity .   Disposition:   FU with undersigned after tests   Thank you for this consultation. We will forwarding this consultation to referring physician.   I spent at least 60 minutes with the patient today and more than 50% of the time was spent counseling the patient and coordinating care.    Signed, Almond LintAileen Rayshon Albaugh, MD  10/06/2016 2:43 PM    [facility]  This note was generated in part with voice recognition software and I apologize for any typographical errors that were not detected and corrected.

## 2016-10-06 NOTE — Telephone Encounter (Signed)
Patient called here today, she is a patient of Dr. Daleen Boavi, she said she needs a medication change because the Abilify 2 mg is making her sleepy. I reviewed patients chart and it looks like she initially saw Dr. Daleen Boavi in November and was prescribed Seroquel, shortly thereafter she called and reported the Seroquel made her too sleepy. She was switched to Abilify by Dr. Lolly MustacheArfeen as Dr. Daleen Boavi was out of the country. Dr. Lolly MustacheArfeen put her on the lowest dose but patient states it is making her too sleepy to take care of her child. I reviewed the chart with Dr. Ladona Ridgelaylor and he said at this time, having not seen the patient he can not change her medication anymore. He advised her to cut the pill in half and take it 2 hours before bedtime instead of right at bedtime, her other option would be to wait until her appointment on 2/1. I called the patient and relayed this information and she voiced her understanding.

## 2016-10-06 NOTE — Progress Notes (Signed)
Pre visit review using our clinic review tool, if applicable. No additional management support is needed unless otherwise documented below in the visit note. 

## 2016-10-06 NOTE — Telephone Encounter (Signed)
pt called againg to find out about what to do about medication. pt called againg to find out about what to do about medication.

## 2016-10-06 NOTE — Patient Instructions (Addendum)
Medication Instructions:  Your physician recommends that you continue on your current medications as directed. Please refer to the Current Medication list given to you today.   Labwork: none  Testing/Procedures: Your physician has requested that you have an echocardiogram. Echocardiography is a painless test that uses sound waves to create images of your heart. It provides your doctor with information about the size and shape of your heart and how well your heart's chambers and valves are working. This procedure takes approximately one hour. There are no restrictions for this procedure.  Your physician has recommended that you wear a holter monitor. Holter monitors are medical devices that record the heart's electrical activity. Doctors most often use these monitors to diagnose arrhythmias. Arrhythmias are problems with the speed or rhythm of the heartbeat. The monitor is a small, portable device. You can wear one while you do your normal daily activities. This is usually used to diagnose what is causing palpitations/syncope (passing out).    Follow-Up: Your physician recommends that you schedule a follow-up appointment with Dr. Alvino Chapel after completion of your echo and monitor.   Any Other Special Instructions Will Be Listed Below (If Applicable).     If you need a refill on your cardiac medications before your next appointment, please call your pharmacy.  Echocardiogram An echocardiogram, or echocardiography, uses sound waves (ultrasound) to produce an image of your heart. The echocardiogram is simple, painless, obtained within a short period of time, and offers valuable information to your health care provider. The images from an echocardiogram can provide information such as:  Evidence of coronary artery disease (CAD).  Heart size.  Heart muscle function.  Heart valve function.  Aneurysm detection.  Evidence of a past heart attack.  Fluid buildup around the heart.  Heart  muscle thickening.  Assess heart valve function. Tell a health care provider about:  Any allergies you have.  All medicines you are taking, including vitamins, herbs, eye drops, creams, and over-the-counter medicines.  Any problems you or family members have had with anesthetic medicines.  Any blood disorders you have.  Any surgeries you have had.  Any medical conditions you have.  Whether you are pregnant or may be pregnant. What happens before the procedure? No special preparation is needed. Eat and drink normally. What happens during the procedure?  In order to produce an image of your heart, gel will be applied to your chest and a wand-like tool (transducer) will be moved over your chest. The gel will help transmit the sound waves from the transducer. The sound waves will harmlessly bounce off your heart to allow the heart images to be captured in real-time motion. These images will then be recorded.  You may need an IV to receive a medicine that improves the quality of the pictures. What happens after the procedure? You may return to your normal schedule including diet, activities, and medicines, unless your health care provider tells you otherwise. This information is not intended to replace advice given to you by your health care provider. Make sure you discuss any questions you have with your health care provider. Document Released: 09/04/2000 Document Revised: 04/25/2016 Document Reviewed: 05/15/2013 Elsevier Interactive Patient Education  2017 Elsevier Inc.  Holter Monitoring Introduction A Holter monitor is a small device that is used to detect abnormal heart rhythms. It clips to your clothing and is connected by wires to flat, sticky disks (electrodes) that attach to your chest. It is worn continuously for 24-48 hours. Follow these instructions at  home:  Wear your Holter monitor at all times, even while exercising and sleeping, for as long as directed by your health  care provider.  Make sure that the Holter monitor is safely clipped to your clothing or close to your body as recommended by your health care provider.  Do not get the monitor or wires wet.  Do not put body lotion or moisturizer on your chest.  Keep your skin clean.  Keep a diary of your daily activities, such as walking and doing chores. If you feel that your heartbeat is abnormal or that your heart is fluttering or skipping a beat:  Record what you are doing when it happens.  Record what time of day the symptoms occur.  Return your Holter monitor as directed by your health care provider.  Keep all follow-up visits as directed by your health care provider. This is important. Get help right away if:  You feel lightheaded or you faint.  You have trouble breathing.  You feel pain in your chest, upper arm, or jaw.  You feel sick to your stomach and your skin is pale, cool, or damp.  You heartbeat feels unusual or abnormal. This information is not intended to replace advice given to you by your health care provider. Make sure you discuss any questions you have with your health care provider. Document Released: 06/05/2004 Document Revised: 02/13/2016 Document Reviewed: 04/16/2014  2017 Elsevier

## 2016-10-06 NOTE — Telephone Encounter (Signed)
Noted.  Discussed with pt during OV today

## 2016-10-06 NOTE — Assessment & Plan Note (Signed)
Chronic. Patient not a candidate for NSAIDs ( h/o gastric bypass) nor cymbalta ( h/o bipolar). I looked up patient on [place] Controlled Substances Reporting System and saw no activity that raised concern of inappropriate use. This will not be a daily medication. Spoke at length about the importance of exercise and PT in the future.

## 2016-10-06 NOTE — Telephone Encounter (Signed)
Patient stated that she was informed that the other psych office in  would not change her medication.  Her phycologist in [place] is currently out of town.  Patient stated Claris CheMargaret may send in another RX. Please advise.

## 2016-10-08 ENCOUNTER — Ambulatory Visit: Payer: 59 | Admitting: Psychology

## 2016-10-09 ENCOUNTER — Encounter: Payer: Self-pay | Admitting: Family

## 2016-10-09 ENCOUNTER — Encounter: Payer: Self-pay | Admitting: *Deleted

## 2016-10-09 NOTE — Telephone Encounter (Signed)
Note-  Sent message to pt via mychart

## 2016-10-15 ENCOUNTER — Other Ambulatory Visit: Payer: Self-pay | Admitting: Family

## 2016-10-15 DIAGNOSIS — M545 Low back pain, unspecified: Secondary | ICD-10-CM

## 2016-10-16 ENCOUNTER — Encounter: Payer: Self-pay | Admitting: Family

## 2016-10-16 ENCOUNTER — Other Ambulatory Visit: Payer: Self-pay | Admitting: Family

## 2016-10-16 ENCOUNTER — Telehealth: Payer: Self-pay | Admitting: *Deleted

## 2016-10-16 DIAGNOSIS — M545 Low back pain, unspecified: Secondary | ICD-10-CM

## 2016-10-16 NOTE — Telephone Encounter (Signed)
Pt called back to follow up on the Rx from yesterday. Please advise?  Call Pt @ [phone]. Thank you!

## 2016-10-16 NOTE — Telephone Encounter (Signed)
I have spoke to [place] from [facility] they will contact pt for 24 hour holter placement.  Orders have been faxed to [facility] for placement.

## 2016-10-22 ENCOUNTER — Other Ambulatory Visit: Payer: Self-pay

## 2016-10-22 ENCOUNTER — Ambulatory Visit: Payer: 59 | Admitting: Psychiatry

## 2016-10-23 ENCOUNTER — Encounter: Payer: Self-pay | Admitting: *Deleted

## 2016-10-23 ENCOUNTER — Other Ambulatory Visit: Payer: Self-pay

## 2016-10-23 ENCOUNTER — Telehealth: Payer: Self-pay | Admitting: Cardiology

## 2016-10-23 NOTE — Telephone Encounter (Signed)
Patient its scheduled for fu next week but has not done echo or holter.  Attempted to call to r/s no ans no vm

## 2016-10-26 ENCOUNTER — Telehealth: Payer: Self-pay

## 2016-10-26 NOTE — Telephone Encounter (Signed)
-----   Message from Madeline FriesMarina C Lopez, [place] sent at 10/26/2016 11:29 AM EST ----- Hello,   Pt needs to be rescheduled pt has not had echo or holter monitor needs to reschedule after testing f/u. I called pt there was no answer.   Thank you,  Jearld AdjutantMarina

## 2016-10-26 NOTE — Telephone Encounter (Signed)
Attempted to contact pt to r/s echo and holter. No VM set up

## 2016-10-26 NOTE — Telephone Encounter (Signed)
Attempted to contact. No ans no vm  

## 2016-10-27 ENCOUNTER — Encounter: Payer: Self-pay | Admitting: *Deleted

## 2016-10-27 NOTE — Telephone Encounter (Signed)
Sent Mychart message to patient regarding appointments

## 2016-10-27 NOTE — Telephone Encounter (Signed)
No answer. No voicemail. 

## 2016-10-28 ENCOUNTER — Encounter: Payer: Self-pay | Admitting: Psychiatry

## 2016-10-28 ENCOUNTER — Ambulatory Visit (INDEPENDENT_AMBULATORY_CARE_PROVIDER_SITE_OTHER): Payer: 59 | Admitting: Psychiatry

## 2016-10-28 VITALS — BP 125/78 | HR 83 | Temp 98.1°F | Wt 152.1 lb

## 2016-10-28 DIAGNOSIS — F411 Generalized anxiety disorder: Secondary | ICD-10-CM

## 2016-10-28 MED ORDER — HYDROXYZINE HCL 10 MG PO TABS: 10.0000 mg | ORAL_TABLET | Freq: Two times a day (BID) | ORAL | 0 refills | Status: DC | PRN

## 2016-10-28 MED ORDER — SERTRALINE HCL 25 MG PO TABS: 25.0000 mg | ORAL_TABLET | Freq: Every day | ORAL | 2 refills | Status: DC

## 2016-10-28 NOTE — Progress Notes (Signed)
Psychiatric Initial Adult Assessment   Patient Identification: Kaiulani Sitton MRN:  [id number] Date of Evaluation:  10/28/2016 Referral Source:Margaret Arnett NP Chief Complaint:    Visit Diagnosis:    ICD-9-CM ICD-10-CM   1. GAD (generalized anxiety disorder) 300.02 F41.1     History of Present Illness:  Patient was seen for a follow-up of her anxiety today. Reports that the Seroquel did not help her and made her very sleepy. During this clinician's absence she was started on Abilify which also made her sleepy. Patient reports that her main symptom is feeling agitated and anxious throughout the day. Reports fair sleep and appetite. Denies any suicidal thoughts.  Past Psychiatric History: None  Previous Psychotropic Medications: Zoloft, Prozac  Substance Abuse History in the last 12 months:  No.  Consequences of Substance Abuse: Negative  Past Medical History:  Past Medical History:  Diagnosis Date  . Anxiety   . Bipolar affective disorder (HCC)   . Cardiac arrhythmia due to congenital heart disease   . GERD (gastroesophageal reflux disease)   . SVT (supraventricular tachycardia) (HCC) 2006   had been on metoprolol 10mg  but stopped taking    Past Surgical History:  Procedure Laterality Date  . CESAREAN SECTION    . GASTRIC BYPASS    . GASTRIC BYPASS      Family Psychiatric History: none  Family History:  Family History  Problem Relation Age of Onset  . Hyperlipidemia Mother   . Diabetes Mother   . Hyperlipidemia Father   . Heart disease Maternal Grandmother   . Diabetes Maternal Grandmother   . Heart disease Maternal Grandfather   . Diabetes Maternal Grandfather   . Heart disease Paternal Grandmother   . Diabetes Paternal Grandmother   . Alcohol abuse Paternal Grandfather   . Heart disease Paternal Grandfather   . Diabetes Paternal Grandfather     Social History:   Social History   Social History  . Marital status: Married    Spouse name: N/A  . Number  of children: N/A  . Years of education: N/A   Social History Main Topics  . Smoking status: Never Smoker  . Smokeless tobacco: Never Used  . Alcohol use No  . Drug use: No  . Sexual activity: Yes    Birth control/ protection: IUD   Other Topics Concern  . Not on file   Social History Narrative   From [place] and grew up here .      Married.      Just moved to from [facility].       2 children, 36yrs- boy and 15 months- girl      Staying home.       Husband is Vet and has PTSD.       Caring for mother.       Had been a CMA at [employer]     Additional Social History:   Allergies:   Allergies  Allergen Reactions  . Penicillins Hives    Metabolic Disorder Labs: No results found for: HGBA1C, MPG No results found for: PROLACTIN No results found for: CHOL, TRIG, HDL, CHOLHDL, VLDL, LDLCALC   Current Medications: Current Outpatient Prescriptions  Medication Sig Dispense Refill  . aspirin 81 MG chewable tablet Chew by mouth daily.    . Biotin 1000 MCG tablet Take 1,000 mcg by mouth 3 (three) times daily.    . cholecalciferol (VITAMIN D) 1000 units tablet Take 2,000 Units by mouth daily.    . clotrimazole (LOTRIMIN) 1 %  cream Apply 1 application topically 2 (two) times daily. 30 g 1  . cyanocobalamin 2000 MCG tablet Take 2,500 mcg by mouth daily.    . cyclobenzaprine (FLEXERIL) 10 MG tablet Take 1 tablet (10 mg total) by mouth at bedtime as needed for muscle spasms. 14 tablet 0  . diclofenac sodium (VOLTAREN) 1 % GEL Apply 4 g topically 4 (four) times daily. 1 Tube 3  . metoprolol succinate (TOPROL XL) 25 MG 24 hr tablet Take 0.5 tablets (12.5 mg total) by mouth daily. 90 tablet 0  . traMADol (ULTRAM) 50 MG tablet Take 0.5 tablets (25 mg total) by mouth daily as needed. 20 tablet 0   No current facility-administered medications for this visit.     Neurologic: Headache: No Seizure: No Paresthesias:No  Musculoskeletal: Strength & Muscle Tone: within normal  limits Gait & Station: normal Patient leans: N/A  Psychiatric Specialty Exam: ROS  There were no vitals taken for this visit.There is no height or weight on file to calculate BMI.  General Appearance: Casual  Eye Contact:  Fair  Speech:  Clear and Coherent  Volume:  Normal  Mood:  anxious  Affect:  Congruent  Thought Process:  Coherent  Orientation:  Full (Time, Place, and Person)  Thought Content:  Logical  Suicidal Thoughts:  No  Homicidal Thoughts:  No  Memory:  Immediate;   Fair Recent;   Fair Remote;   Fair  Judgement:  Fair  Insight:  Fair  Psychomotor Activity:  Normal  Concentration:  Concentration: Fair and Attention Span: Fair  Recall:  [employer]Fair  Fund of Knowledge:Fair  Language: Fair  Akathisia:  No  Handed:  Right  AIMS (if indicated):  na  Assets:  Communication Skills Desire for Improvement Financial Resources/Insurance Physical Health Social Support  ADL's:  Intact  Cognition: WNL  Sleep:  fair    Treatment Plan Summary   Generalized anxiety disorder Start Zoloft at 25mg  poq d. Start vistaril at 10mg  1-2 times daily prn for anxiety.  Return to clinic in 2 weeks time or call before if needed    Patrick NorthAVI, Rochell Puett, MD 2/7/20184:41 PM

## 2016-10-29 ENCOUNTER — Ambulatory Visit: Payer: 59 | Admitting: Cardiology

## 2016-11-03 ENCOUNTER — Other Ambulatory Visit: Payer: Self-pay | Admitting: Family

## 2016-11-03 DIAGNOSIS — M545 Low back pain, unspecified: Secondary | ICD-10-CM

## 2016-11-03 DIAGNOSIS — R002 Palpitations: Secondary | ICD-10-CM

## 2016-11-04 ENCOUNTER — Ambulatory Visit: Payer: 59 | Admitting: Licensed Clinical Social Worker

## 2016-11-05 ENCOUNTER — Other Ambulatory Visit: Payer: Self-pay | Admitting: Family

## 2016-11-05 ENCOUNTER — Encounter: Payer: Self-pay | Admitting: Family

## 2016-11-05 DIAGNOSIS — M545 Low back pain, unspecified: Secondary | ICD-10-CM

## 2016-11-05 DIAGNOSIS — R002 Palpitations: Secondary | ICD-10-CM

## 2016-11-05 MED ORDER — TRAMADOL HCL 50 MG PO TABS: 25.0000 mg | ORAL_TABLET | Freq: Every day | ORAL | 0 refills | Status: DC | PRN

## 2016-11-05 MED ORDER — METOPROLOL SUCCINATE ER 25 MG PO TB24: 12.5000 mg | ORAL_TABLET | Freq: Every day | ORAL | 0 refills | Status: DC

## 2016-11-05 NOTE — Progress Notes (Signed)
Message sent

## 2016-11-10 ENCOUNTER — Ambulatory Visit: Payer: Self-pay | Admitting: Family

## 2016-11-10 NOTE — Progress Notes (Unsigned)
Subjective:    Patient ID: Madeline White, female    DOB: [birth year], 31 y.o.   MRN: [id number]  CC: Madeline White is a 31 y.o. female who presents today for follow up.   HPI: HPI 2/7 Dr Daleen Boravi Started on vistaril prn for anxiety. Stopped seroquel     HISTORY:  Past Medical History:  Diagnosis Date  . Anxiety   . Bipolar affective disorder (HCC)   . Cardiac arrhythmia due to congenital heart disease   . GERD (gastroesophageal reflux disease)   . SVT (supraventricular tachycardia) (HCC) 2006   had been on metoprolol 10mg  but stopped taking   Past Surgical History:  Procedure Laterality Date  . CESAREAN SECTION    . GASTRIC BYPASS    . GASTRIC BYPASS     Family History  Problem Relation Age of Onset  . Hyperlipidemia Mother   . Diabetes Mother   . Hyperlipidemia Father   . Heart disease Maternal Grandmother   . Diabetes Maternal Grandmother   . Heart disease Maternal Grandfather   . Diabetes Maternal Grandfather   . Heart disease Paternal Grandmother   . Diabetes Paternal Grandmother   . Alcohol abuse Paternal Grandfather   . Heart disease Paternal Grandfather   . Diabetes Paternal Grandfather     Allergies: Penicillins Current Outpatient Prescriptions on File Prior to Visit  Medication Sig Dispense Refill  . aspirin 81 MG chewable tablet Chew by mouth daily.    . Biotin 1000 MCG tablet Take 1,000 mcg by mouth 3 (three) times daily.    . cholecalciferol (VITAMIN D) 1000 units tablet Take 2,000 Units by mouth daily.    . clotrimazole (LOTRIMIN) 1 % cream Apply 1 application topically 2 (two) times daily. 30 g 1  . cyanocobalamin 2000 MCG tablet Take 2,500 mcg by mouth daily.    . cyclobenzaprine (FLEXERIL) 10 MG tablet Take 1 tablet (10 mg total) by mouth at bedtime as needed for muscle spasms. 14 tablet 0  . diclofenac sodium (VOLTAREN) 1 % GEL Apply 4 g topically 4 (four) times daily. 1 Tube 3  . hydrOXYzine (ATARAX/VISTARIL) 10 MG tablet Take 1 tablet (10 mg  total) by mouth 2 (two) times daily as needed. 30 tablet 0  . metoprolol succinate (TOPROL XL) 25 MG 24 hr tablet Take 0.5 tablets (12.5 mg total) by mouth daily. 90 tablet 0  . sertraline (ZOLOFT) 25 MG tablet Take 1 tablet (25 mg total) by mouth daily. 30 tablet 2  . traMADol (ULTRAM) 50 MG tablet Take 0.5 tablets (25 mg total) by mouth daily as needed. 20 tablet 0   No current facility-administered medications on file prior to visit.     Social History  Substance Use Topics  . Smoking status: Never Smoker  . Smokeless tobacco: Never Used  . Alcohol use No    Review of Systems    Objective:    There were no vitals taken for this visit. BP Readings from Last 3 Encounters:  10/06/16 120/80  10/06/16 120/75  10/01/16 106/70   Wt Readings from Last 3 Encounters:  10/06/16 151 lb (68.5 kg)  10/06/16 151 lb 9.6 oz (68.8 kg)  10/01/16 151 lb 3.2 oz (68.6 kg)    Physical Exam     Assessment & Plan:   Problem List Items Addressed This Visit    None       I am having Ms. Gilkes maintain her clotrimazole, cholecalciferol, aspirin, cyanocobalamin, Biotin, cyclobenzaprine, diclofenac sodium, sertraline, hydrOXYzine, metoprolol succinate,  and traMADol.   No orders of the defined types were placed in this encounter.   Return precautions given.   Risks, benefits, and alternatives of the medications and treatment plan prescribed today were discussed, and patient expressed understanding.   Education regarding symptom management and diagnosis given to patient on AVS.  Continue to follow with Rennie Plowman, FNP for routine health maintenance.   Madeline White and I agreed with plan.   Rennie Plowman, FNP

## 2016-11-11 ENCOUNTER — Ambulatory Visit: Payer: 59 | Admitting: Psychiatry

## 2016-11-12 ENCOUNTER — Encounter: Payer: Self-pay | Admitting: Psychiatry

## 2016-11-12 ENCOUNTER — Ambulatory Visit (INDEPENDENT_AMBULATORY_CARE_PROVIDER_SITE_OTHER): Payer: 59 | Admitting: Psychiatry

## 2016-11-12 VITALS — BP 119/73 | HR 82 | Temp 97.9°F | Wt 152.4 lb

## 2016-11-12 DIAGNOSIS — F3130 Bipolar disorder, current episode depressed, mild or moderate severity, unspecified: Secondary | ICD-10-CM | POA: Diagnosis not present

## 2016-11-12 DIAGNOSIS — F411 Generalized anxiety disorder: Secondary | ICD-10-CM | POA: Diagnosis not present

## 2016-11-12 NOTE — Progress Notes (Signed)
Psychiatric progress note  Patient Identification: Madeline White MRN:  [id number] Date of Evaluation:  11/12/2016 Referral Source:Margaret Arnett NP Chief Complaint:   Chief Complaint    Follow-up; Medication Refill     Visit Diagnosis:    ICD-9-CM ICD-10-CM   1. GAD (generalized anxiety disorder) 300.02 F41.1   2. Bipolar affective disorder, current episode depressed, current episode severity unspecified (HCC) 296.50 F31.30     History of Present Illness:  Patient was seen for a follow-up of her anxiety today.  She reports feeling more agitated on the zoloft. States she does not think it has helped with her anxiety. She had ADHD as a child and used to take adderall. she stopped taking the adderall when she became pregnant with her son. Reports fair sleep and appetite. Denies any suicidal thoughts.  Past Psychiatric History: None  Previous Psychotropic Medications: Zoloft, Prozac  Substance Abuse History in the last 12 months:  No.  Consequences of Substance Abuse: Negative  Past Medical History:  Past Medical History:  Diagnosis Date  . Anxiety   . Bipolar affective disorder (HCC)   . Cardiac arrhythmia due to congenital heart disease   . GERD (gastroesophageal reflux disease)   . SVT (supraventricular tachycardia) (HCC) 2006   had been on metoprolol 10mg  but stopped taking    Past Surgical History:  Procedure Laterality Date  . CESAREAN SECTION    . GASTRIC BYPASS    . GASTRIC BYPASS      Family Psychiatric History: none  Family History:  Family History  Problem Relation Age of Onset  . Hyperlipidemia Mother   . Diabetes Mother   . Hyperlipidemia Father   . Heart disease Maternal Grandmother   . Diabetes Maternal Grandmother   . Heart disease Maternal Grandfather   . Diabetes Maternal Grandfather   . Heart disease Paternal Grandmother   . Diabetes Paternal Grandmother   . Alcohol abuse Paternal Grandfather   . Heart disease Paternal Grandfather   .  Diabetes Paternal Grandfather     Social History:   Social History   Social History  . Marital status: Married    Spouse name: N/A  . Number of children: N/A  . Years of education: N/A   Social History Main Topics  . Smoking status: Never Smoker  . Smokeless tobacco: Never Used  . Alcohol use No  . Drug use: No  . Sexual activity: Yes    Birth control/ protection: IUD   Other Topics Concern  . None   Social History Narrative   From [place] and grew up here .      Married.      [place] [facility] [place]       2 children, 4777yrs- boy and 15 months- girl      Staying home.       Husband is Vet and has PTSD.       Caring for mother.       Had been a CMA at [employer].     Additional Social History:   Allergies:   Allergies  Allergen Reactions  . Penicillins Hives    Metabolic Disorder Labs: No results found for: HGBA1C, MPG No results found for: PROLACTIN No results found for: CHOL, TRIG, HDL, CHOLHDL, VLDL, LDLCALC   Current Medications: Current Outpatient Prescriptions  Medication Sig Dispense Refill  . aspirin 81 MG chewable tablet Chew by mouth daily.    . Biotin 1000 MCG tablet Take 1,000 mcg by mouth 3 (three) times  daily.    . cholecalciferol (VITAMIN D) 1000 units tablet Take 2,000 Units by mouth daily.    . clotrimazole (LOTRIMIN) 1 % cream Apply 1 application topically 2 (two) times daily. 30 g 1  . cyanocobalamin 2000 MCG tablet Take 2,500 mcg by mouth daily.    . cyclobenzaprine (FLEXERIL) 10 MG tablet Take 1 tablet (10 mg total) by mouth at bedtime as needed for muscle spasms. 14 tablet 0  . diclofenac sodium (VOLTAREN) 1 % GEL Apply 4 g topically 4 (four) times daily. 1 Tube 3  . hydrOXYzine (ATARAX/VISTARIL) 10 MG tablet Take 1 tablet (10 mg total) by mouth 2 (two) times daily as needed. 30 tablet 0  . metoprolol succinate (TOPROL XL) 25 MG 24 hr tablet Take 0.5 tablets (12.5 mg total) by mouth daily. 90 tablet 0  . sertraline (ZOLOFT) 25  MG tablet Take 1 tablet (25 mg total) by mouth daily. 30 tablet 2  . traMADol (ULTRAM) 50 MG tablet Take 0.5 tablets (25 mg total) by mouth daily as needed. 20 tablet 0   No current facility-administered medications for this visit.     Neurologic: Headache: No Seizure: No Paresthesias:No  Musculoskeletal: Strength & Muscle Tone: within normal limits Gait & Station: normal Patient leans: N/A  Psychiatric Specialty Exam: ROS  Blood pressure 119/73, pulse 82, temperature 97.9 F (36.6 C), temperature source Oral, weight 152 lb 6.4 oz (69.1 kg).Body mass index is 24.6 kg/m.  General Appearance: Casual  Eye Contact:  Fair  Speech:  Clear and Coherent  Volume:  Normal  Mood:  anxious  Affect:  Congruent  Thought Process:  Coherent  Orientation:  Full (Time, Place, and Person)  Thought Content:  Logical  Suicidal Thoughts:  No  Homicidal Thoughts:  No  Memory:  Immediate;   Fair Recent;   Fair Remote;   Fair  Judgement:  Fair  Insight:  Fair  Psychomotor Activity:  Normal  Concentration:  Concentration: Fair and Attention Span: Fair  Recall:  [employer] of Knowledge:Fair  Language: Fair  Akathisia:  No  Handed:  Right  AIMS (if indicated):  na  Assets:  Communication Skills Desire for Improvement Financial Resources/Insurance Physical Health Social Support  ADL's:  Intact  Cognition: WNL  Sleep:  fair    Treatment Plan Summary   Generalized anxiety disorder Discontinue Zoloft at 25mg  poq d. Discontinue vistaril at 10mg  1-2 times daily prn for anxiety.  Recommend that patient start to see a therapist on a weekly basis and address any triggers for her anxiety. She is to follow-up with Dr. Elna Breslow for evaluation for ADHD.  Return to clinic in 6 weeks time or call before if needed    Patrick North, MD 2/22/20182:04 PM

## 2016-11-16 ENCOUNTER — Telehealth: Payer: Self-pay

## 2016-11-16 NOTE — Telephone Encounter (Signed)
pt called states that she spoke with her mother and she states her mom has same issues and she takes prozac.  patient wants to know if she can try prozac an see if it helps her.

## 2016-11-16 NOTE — Telephone Encounter (Signed)
Ok. Please call in Prozac at 10 mg daily, 30 tablets, no refills and have her come back and see me in 2 weeks

## 2016-11-17 ENCOUNTER — Other Ambulatory Visit (HOSPITAL_COMMUNITY): Payer: Self-pay | Admitting: Psychiatry

## 2016-11-17 MED ORDER — FLUOXETINE HCL 10 MG PO CAPS
10.0000 mg | ORAL_CAPSULE | Freq: Every day | ORAL | 2 refills | Status: DC
Start: 2016-11-17 — End: 2016-12-02

## 2016-11-17 NOTE — Telephone Encounter (Signed)
left message on doctor line for prozac 10mg    #30 no additional refills

## 2016-11-17 NOTE — Telephone Encounter (Signed)
Pt was called and told rx would be called in but that dr. Daleen Boavi wanted to see patient in 2 weeks.

## 2016-11-24 ENCOUNTER — Telehealth: Payer: Self-pay

## 2016-11-24 ENCOUNTER — Other Ambulatory Visit: Payer: Self-pay

## 2016-11-24 ENCOUNTER — Ambulatory Visit (INDEPENDENT_AMBULATORY_CARE_PROVIDER_SITE_OTHER): Payer: 59

## 2016-11-24 ENCOUNTER — Other Ambulatory Visit: Payer: Self-pay | Admitting: Cardiology

## 2016-11-24 DIAGNOSIS — R002 Palpitations: Secondary | ICD-10-CM

## 2016-11-24 NOTE — Telephone Encounter (Signed)
pt called states she was seen on 11-12-16 and that she can not sleep.  she states she told you this at her last visit and she thought you was going to give her something to take to help sleept but there is nothing at pharmacy.

## 2016-11-24 NOTE — Telephone Encounter (Signed)
Pt was called and told that it can be discussed at next weeks appt.

## 2016-11-24 NOTE — Telephone Encounter (Signed)
Please let patient know that we will discuss this at her next appointment. She has been on medications that have made her too sleepy in the past and this needs to be discussed in an appointment

## 2016-11-26 ENCOUNTER — Ambulatory Visit (INDEPENDENT_AMBULATORY_CARE_PROVIDER_SITE_OTHER): Payer: 59

## 2016-11-26 ENCOUNTER — Telehealth: Payer: Self-pay | Admitting: *Deleted

## 2016-11-26 DIAGNOSIS — R002 Palpitations: Secondary | ICD-10-CM | POA: Diagnosis not present

## 2016-11-26 NOTE — Telephone Encounter (Signed)
I spoke with Madeline White and she mentioned pt wore monitor but it only recorded 6 hours of data and is considered uncomplete testing which pt will not be billed for monitor. Pt was called several times on their end to come in and have monitor placed but due to pt calling she never called them back to have monitor replaced for retesting.   I spoke with pt today and she said she was in the process of moving and she told them to give her a wk before replacement. She is aware that we can schedule her appointment for placement of monitor here. She requested to go to [facility] for replacement due to it being closer to her home. She will see if they can get her in today for placement because rather wear monitor over weekend and have results by schedule appointment 12/01/16. She does not want to reschedule office visit if she can have holter testing complete by appointment day. She will contact us if she has to come here for placement and if we need to change her appointment day.

## 2016-11-26 NOTE — Telephone Encounter (Signed)
Pt has set appointment with [facility] for 24 hour holter replacement 11/26/16 @ 1:00 pm. I spoke with Seward Speckkayron and she mentioned holter results should be back in time for her appointment 12/01/16 if pt brings monitor back tomorrow before 1:00pm.

## 2016-11-30 ENCOUNTER — Other Ambulatory Visit: Payer: Self-pay | Admitting: Family

## 2016-11-30 ENCOUNTER — Telehealth: Payer: Self-pay | Admitting: *Deleted

## 2016-11-30 DIAGNOSIS — R002 Palpitations: Secondary | ICD-10-CM

## 2016-11-30 DIAGNOSIS — M545 Low back pain, unspecified: Secondary | ICD-10-CM

## 2016-11-30 MED ORDER — METOPROLOL SUCCINATE ER 25 MG PO TB24
12.5000 mg | ORAL_TABLET | Freq: Every day | ORAL | 0 refills | Status: DC
Start: 2016-11-30 — End: 2016-12-01

## 2016-11-30 NOTE — Telephone Encounter (Signed)
Requested medication refill for :metoprolol and tramadol Pharmacy:[facility]  Please Contact Pt when ready or sent to Pharmacy:  [phone]

## 2016-11-30 NOTE — Telephone Encounter (Signed)
Metoprolol has been refilled. Is it ok to send in new RX for tramadol

## 2016-12-01 ENCOUNTER — Encounter: Payer: Self-pay | Admitting: Family

## 2016-12-01 ENCOUNTER — Ambulatory Visit (INDEPENDENT_AMBULATORY_CARE_PROVIDER_SITE_OTHER): Payer: 59 | Admitting: Cardiology

## 2016-12-01 ENCOUNTER — Encounter: Payer: Self-pay | Admitting: Cardiology

## 2016-12-01 ENCOUNTER — Other Ambulatory Visit: Payer: Self-pay | Admitting: Family

## 2016-12-01 ENCOUNTER — Telehealth: Payer: Self-pay | Admitting: Family

## 2016-12-01 ENCOUNTER — Other Ambulatory Visit: Payer: Self-pay | Admitting: *Deleted

## 2016-12-01 VITALS — BP 120/78 | HR 79 | Ht 66.0 in | Wt 148.5 lb

## 2016-12-01 DIAGNOSIS — R002 Palpitations: Secondary | ICD-10-CM

## 2016-12-01 DIAGNOSIS — Z8679 Personal history of other diseases of the circulatory system: Secondary | ICD-10-CM

## 2016-12-01 DIAGNOSIS — M545 Low back pain, unspecified: Secondary | ICD-10-CM

## 2016-12-01 MED ORDER — TRAMADOL HCL 50 MG PO TABS: ORAL_TABLET | ORAL | 0 refills | Status: DC

## 2016-12-01 NOTE — Progress Notes (Signed)
Cardiology Office Note   Date:  12/01/2016   ID:  Madeline White, DOB Aug 30, 1986, MRN [id number]  Referring Doctor:  Rennie Plowman, FNP   Cardiologist:   Almond Lint, MD   Reason for consultation:  Chief Complaint  Patient presents with  . other     Follow up echo/holter. Pt states she is doing well. Reviewed meds with pt verbally.      History of Present Illness: Madeline White is a 31 y.o. female who presents for Follow-up after tests  Review of medical records show:  Patient reports that she was diagnosed with SVT when she was much younger. She recalls being on medication, likely Toprol, 50 mg a day. She did not go for any procedure. Somehow, that medication was discontinued. Recently, the last several months, she started having episodes of palpitations where she would feel her heart racing. Moderate in severity, not as severe as her SVT episodes when she was much younger. No associated presyncope or syncope. No chest pain or shortness of breath. Symptoms will last a few minutes at a time, eventually resolving spontaneously. She was then restarted on metoprolol, she does not feel it helped with her symptoms. She is in a much lower dose now compared to what she was taking when she was much younger.  Since last visit, she had occasional episodes of palpitations for a brief period of time. No shortness of breath, chest pain. She is taking the full 25 mg tablet of Toprol XL once a day now.  ROS:  Please see the history of present illness. Aside from mentioned under HPI, all other systems are reviewed and negative.    Past Medical History:  Diagnosis Date  . Anxiety   . Bipolar affective disorder (HCC)   . Cardiac arrhythmia due to congenital heart disease   . GERD (gastroesophageal reflux disease)   . SVT (supraventricular tachycardia) (HCC) 2006   had been on metoprolol 10mg  but stopped taking    Past Surgical History:  Procedure Laterality Date  . CESAREAN SECTION     . GASTRIC BYPASS    . GASTRIC BYPASS       reports that she has never smoked. She has never used smokeless tobacco. She reports that she does not drink alcohol or use drugs.   family history includes Alcohol abuse in her paternal grandfather; Diabetes in her maternal grandfather, maternal grandmother, mother, paternal grandfather, and paternal grandmother; Heart disease in her maternal grandfather, maternal grandmother, paternal grandfather, and paternal grandmother; Hyperlipidemia in her father and mother.   Outpatient Medications Prior to Visit  Medication Sig Dispense Refill  . aspirin 81 MG chewable tablet Chew by mouth daily.    . Biotin 1000 MCG tablet Take 1,000 mcg by mouth 3 (three) times daily.    . cholecalciferol (VITAMIN D) 1000 units tablet Take 2,000 Units by mouth daily.    . clotrimazole (LOTRIMIN) 1 % cream Apply 1 application topically 2 (two) times daily. 30 g 1  . cyanocobalamin 2000 MCG tablet Take 2,500 mcg by mouth daily.    . cyclobenzaprine (FLEXERIL) 10 MG tablet Take 1 tablet (10 mg total) by mouth at bedtime as needed for muscle spasms. 14 tablet 0  . diclofenac sodium (VOLTAREN) 1 % GEL Apply 4 g topically 4 (four) times daily. 1 Tube 3  . FLUoxetine (PROZAC) 10 MG capsule Take 1 capsule (10 mg total) by mouth daily. 30 capsule 2  . hydrOXYzine (ATARAX/VISTARIL) 10 MG tablet Take 1 tablet (10  mg total) by mouth 2 (two) times daily as needed. 30 tablet 0  . traMADol (ULTRAM) 50 MG tablet Take one half to one tablet as needed for pain. 20 tablet 0  . metoprolol succinate (TOPROL XL) 25 MG 24 hr tablet Take 0.5 tablets (12.5 mg total) by mouth daily. (Patient not taking: Reported on 12/01/2016) 90 tablet 0   No facility-administered medications prior to visit.      Allergies: Penicillins    PHYSICAL EXAM: VS:  BP 120/78 (BP Location: Left Arm, Patient Position: Sitting, Cuff Size: Normal)   Pulse 79   Ht 5\' 6"  (1.676 m)   Wt 148 lb 8 oz (67.4 kg)   BMI  23.97 kg/m  , Body mass index is 23.97 kg/m. Wt Readings from Last 3 Encounters:  12/01/16 148 lb 8 oz (67.4 kg)  10/06/16 151 lb (68.5 kg)  10/06/16 151 lb 9.6 oz (68.8 kg)    PHYSICAL EXAM: VS:  BP 120/78 (BP Location: Left Arm, Patient Position: Sitting, Cuff Size: Normal)   Pulse 79   Ht 5\' 6"  (1.676 m)   Wt 148 lb 8 oz (67.4 kg)   BMI 23.97 kg/m  , BMI Body mass index is 23.97 kg/m. GENERAL:  well developed, well nourished, obese, not in acute distress HEENT: normocephalic, pink conjunctivae, anicteric sclerae, no xanthelasma, normal dentition, oropharynx clear NECK:  no neck vein engorgement, JVP normal, no hepatojugular reflux, carotid upstroke brisk and symmetric, no bruit, no thyromegaly, no lymphadenopathy LUNGS:  good respiratory effort, clear to auscultation bilaterally CV:  PMI not displaced, no thrills, no lifts, S1 and S2 within normal limits, no palpable S3 or S4, no murmurs, no rubs, no gallops ABD:  Soft, nontender, nondistended, normoactive bowel sounds, no abdominal aortic bruit, no hepatomegaly, no splenomegaly MS: nontender back, no kyphosis, no scoliosis, no joint deformities EXT:  2+ DP/PT pulses, no edema, no varicosities, no cyanosis, no clubbing SKIN: warm, nondiaphoretic, normal turgor, no ulcers NEUROPSYCH: alert, oriented to person, place, and time, sensory/motor grossly intact, normal mood, appropriate affect   Recent Labs: No results found for requested labs within last 8760 hours.   Lipid Panel No results found for: CHOL, TRIG, HDL, CHOLHDL, VLDL, LDLCALC, LDLDIRECT   Other studies Reviewed:  EKG:  The ekg from 10/06/2016 was personally reviewed by me and it revealed sinus rhythm, 75 BPM.  Additional studies/ records that were reviewed personally reviewed by me today include:  Echo 11/24/2016: - Left ventricle: The cavity size was normal. Wall thickness was   normal. Systolic function was at the lower limits of normal. The   estimated ejection  fraction was in the range of 50% to 55%. Wall   motion was normal; there were no regional wall motion   abnormalities. Left ventricular diastolic function parameters   were normal. - Pulmonary arteries: Systolic pressure was within the normal   range.    ASSESSMENT AND PLAN:  Palpitations History of SVT Echo shows preserved ejection fraction. Holter from [facility] was essentially unremarkable. No SVT detected. Continue current dose of metoprolol, Toprol XL 25 daily. Recommend that patient monitor her blood pressure and her heart rate. Potentially, he may be room to up titrate the dose. For now, continue same dose that she is not overly symptomatic. Patient advised to let us know if she has had any more episodes, we can always do a long-term monitor.  Current medicines are reviewed at length with the patient today.  The patient does not have concerns regarding  medicines.  Labs/ tests ordered today include:  No orders of the defined types were placed in this encounter.   I had a lengthy and detailed discussion with the patient regarding diagnoses, prognosis, diagnostic options, treatment options , and side effects of medications.   I counseled the patient on importance of lifestyle modification including heart healthy diet, regular physical activity .   Disposition:   FU with cardiology in 6 months  I spent at least with the patient today and more than 50% of the time was spent counseling the patient and coordinating care.    Signed, Almond Lint, MD  12/01/2016 3:51 PM    [facility]  This note was generated in part with voice recognition software and I apologize for any typographical errors that were not detected and corrected.

## 2016-12-01 NOTE — Telephone Encounter (Signed)
Pt called back and wants to know why there are no refills on the rx. Please advise, thank you!  Call pt @ [phone]

## 2016-12-01 NOTE — Telephone Encounter (Signed)
Spoke with pharmacy.  20 tabs is for 30 day supply  This is not scheduled medication. It is only as needed.   Mal AmabileBrock will fax over refill when med due 3/15.

## 2016-12-01 NOTE — Telephone Encounter (Signed)
[facility] pharmacy requested the 30 day refill for this medication-per pt request Contact [facility]  [phone]

## 2016-12-01 NOTE — Telephone Encounter (Signed)
Pt called and stated that the pharmacy does not have her rx for traMADol (ULTRAM) 50 MG tablet. Please advise, thank you!'  Pharmacy - [facility] [place] - [id number]

## 2016-12-01 NOTE — Telephone Encounter (Signed)
Just keep same rx? Please advise.

## 2016-12-01 NOTE — Telephone Encounter (Signed)
It is as stated before correct? A not scheduled medication

## 2016-12-01 NOTE — Patient Instructions (Signed)
Follow-Up: Your physician wants you to follow-up in: 6 months. You will receive a reminder letter in the mail two months in advance. If you don't receive a letter, please call our office to schedule the follow-up appointment.  It was a pleasure seeing you today here in the office. Please do not hesitate to give us a call back if you have any further questions. [phone]  Grasiela Jonsson A. RN, BSN     

## 2016-12-02 ENCOUNTER — Ambulatory Visit (INDEPENDENT_AMBULATORY_CARE_PROVIDER_SITE_OTHER): Payer: 59 | Admitting: Psychiatry

## 2016-12-02 ENCOUNTER — Encounter: Payer: Self-pay | Admitting: Family

## 2016-12-02 ENCOUNTER — Encounter: Payer: Self-pay | Admitting: Psychiatry

## 2016-12-02 VITALS — BP 119/71 | HR 77 | Temp 97.9°F | Wt 152.2 lb

## 2016-12-02 DIAGNOSIS — F411 Generalized anxiety disorder: Secondary | ICD-10-CM

## 2016-12-02 MED ORDER — FLUOXETINE HCL 20 MG PO CAPS: 20.0000 mg | ORAL_CAPSULE | Freq: Every day | ORAL | 2 refills | Status: DC

## 2016-12-02 MED ORDER — TRAZODONE HCL 50 MG PO TABS: 50.0000 mg | ORAL_TABLET | Freq: Every day | ORAL | 1 refills | Status: DC

## 2016-12-02 NOTE — Progress Notes (Signed)
Psychiatric progress note  Patient Identification: Madeline White MRN:  [id number] Date of Evaluation:  12/02/2016 Referral Source:Margaret Arnett NP Chief Complaint:  Feeling better Chief Complaint    Follow-up; Medication Refill     Visit Diagnosis:    ICD-9-CM ICD-10-CM   1. GAD (generalized anxiety disorder) 300.02 F41.1     History of Present Illness:  Patient was seen for a follow-up of her anxiety today.  Reports that she has been doing extremely well on the Prozac. Patient had called a week ago saying that her mom takes Prozac and she would like to try it. She was started on 10 mg and states tolerating it well. States that over the past week she took 20 mg of Prozac accidentally which was her husband's and states that she's been doing quite well on it. States that she is less irritable and down has not been snapping at all. She feels quite well. Continues to have some trouble falling asleep and staying asleep. Denies any suicidal thoughts.  Past Psychiatric History: None  Previous Psychotropic Medications: Zoloft, Prozac  Substance Abuse History in the last 12 months:  No.  Consequences of Substance Abuse: Negative  Past Medical History:  Past Medical History:  Diagnosis Date  . Anxiety   . Bipolar affective disorder (HCC)   . Cardiac arrhythmia due to congenital heart disease   . GERD (gastroesophageal reflux disease)   . SVT (supraventricular tachycardia) (HCC) 2006   had been on metoprolol 10mg  but stopped taking    Past Surgical History:  Procedure Laterality Date  . CESAREAN SECTION    . GASTRIC BYPASS    . GASTRIC BYPASS      Family Psychiatric History: none  Family History:  Family History  Problem Relation Age of Onset  . Hyperlipidemia Mother   . Diabetes Mother   . Hyperlipidemia Father   . Heart disease Maternal Grandmother   . Diabetes Maternal Grandmother   . Heart disease Maternal Grandfather   . Diabetes Maternal Grandfather   . Heart  disease Paternal Grandmother   . Diabetes Paternal Grandmother   . Alcohol abuse Paternal Grandfather   . Heart disease Paternal Grandfather   . Diabetes Paternal Grandfather     Social History:   Social History   Social History  . Marital status: Married    Spouse name: N/A  . Number of children: N/A  . Years of education: N/A   Social History Main Topics  . Smoking status: Never Smoker  . Smokeless tobacco: Never Used  . Alcohol use No  . Drug use: No  . Sexual activity: Yes    Birth control/ protection: IUD   Other Topics Concern  . None   Social History Narrative   From [place] and grew up here .      Married.      Just moved to from [facility].       2 children, 66yrs- boy and 15 months- girl      Staying home.       Husband is Vet and has PTSD.       Caring for mother.       Had been a CMA at [employer].     Additional Social History:   Allergies:   Allergies  Allergen Reactions  . Penicillins Hives    Metabolic Disorder Labs: No results found for: HGBA1C, MPG No results found for: PROLACTIN No results found for: CHOL, TRIG, HDL, CHOLHDL, VLDL, LDLCALC   Current Medications: Current  Outpatient Prescriptions  Medication Sig Dispense Refill  . aspirin 81 MG chewable tablet Chew by mouth daily.    . Biotin 1000 MCG tablet Take 1,000 mcg by mouth 3 (three) times daily.    . cholecalciferol (VITAMIN D) 1000 units tablet Take 2,000 Units by mouth daily.    . cyanocobalamin 2000 MCG tablet Take 2,500 mcg by mouth daily.    . famotidine (PEPCID) 20 MG tablet Take 20 mg by mouth daily.    Marland Kitchen. FLUoxetine (PROZAC) 20 MG capsule Take 1 capsule (20 mg total) by mouth daily. 30 capsule 2  . hydrOXYzine (ATARAX/VISTARIL) 10 MG tablet Take 1 tablet (10 mg total) by mouth 2 (two) times daily as needed. 30 tablet 0  . metoprolol succinate (TOPROL-XL) 25 MG 24 hr tablet Take 25 mg by mouth daily.    . traMADol (ULTRAM) 50 MG tablet Take one half to one tablet as  needed for pain. 20 tablet 0  . traZODone (DESYREL) 50 MG tablet Take 1 tablet (50 mg total) by mouth at bedtime. 30 tablet 1  . clotrimazole (LOTRIMIN) 1 % cream Apply 1 application topically 2 (two) times daily. (Patient not taking: Reported on 12/02/2016) 30 g 1  . cyclobenzaprine (FLEXERIL) 10 MG tablet Take 1 tablet (10 mg total) by mouth at bedtime as needed for muscle spasms. (Patient not taking: Reported on 12/02/2016) 14 tablet 0  . diclofenac sodium (VOLTAREN) 1 % GEL Apply 4 g topically 4 (four) times daily. (Patient not taking: Reported on 12/02/2016) 1 Tube 3   No current facility-administered medications for this visit.     Neurologic: Headache: No Seizure: No Paresthesias:No  Musculoskeletal: Strength & Muscle Tone: within normal limits Gait & Station: normal Patient leans: N/A  Psychiatric Specialty Exam: ROS  Blood pressure 119/71, pulse 77, temperature 97.9 F (36.6 C), temperature source Oral, weight 152 lb 3.2 oz (69 kg).Body mass index is 24.57 kg/m.  General Appearance: Casual  Eye Contact:  Fair  Speech:  Clear and Coherent  Volume:  Normal  Mood:  anxious  Affect:  Congruent  Thought Process:  Coherent  Orientation:  Full (Time, Place, and Person)  Thought Content:  Logical  Suicidal Thoughts:  No  Homicidal Thoughts:  No  Memory:  Immediate;   Fair Recent;   Fair Remote;   Fair  Judgement:  Fair  Insight:  Fair  Psychomotor Activity:  Normal  Concentration:  Concentration: Fair and Attention Span: Fair  Recall:  [employer]Fair  Fund of Knowledge:Fair  Language: Fair  Akathisia:  No  Handed:  Right  AIMS (if indicated):  na  Assets:  Communication Skills Desire for Improvement Financial Resources/Insurance Physical Health Social Support  ADL's:  Intact  Cognition: WNL  Sleep:  fair    Treatment Plan Summary   Generalized anxiety disorder Increase Prozac to 20 mg once daily.  Insomnia Start trazodone at 50 mg at bedtime.  Recommend that  patient start to see a therapist on a weekly basis and address any triggers for her anxiety. She is to follow-up with Dr. Elna BreslowBruce Thompson for evaluation for ADHD.  Return to clinic in 4 weeks time or call before if needed    Patrick NorthAVI, Kortlynn Poust, MD 3/14/201811:46 AM

## 2016-12-03 ENCOUNTER — Other Ambulatory Visit: Payer: Self-pay | Admitting: Family

## 2016-12-03 ENCOUNTER — Telehealth: Payer: Self-pay | Admitting: Family

## 2016-12-03 DIAGNOSIS — M545 Low back pain, unspecified: Secondary | ICD-10-CM

## 2016-12-03 MED ORDER — TRAMADOL HCL 50 MG PO TABS: ORAL_TABLET | ORAL | 0 refills | Status: DC

## 2016-12-03 NOTE — Telephone Encounter (Signed)
Pharmacy called and is looking to clarify some instructions on pt's traMADol (ULTRAM) 50 MG tablet. Please advise, thank you!  Pharmacy - [facility] - [place], [place] - [phone]

## 2016-12-03 NOTE — Progress Notes (Signed)
faxed

## 2016-12-03 NOTE — Telephone Encounter (Signed)
Spoke with the pharmacy and clarified the orders. thanks

## 2016-12-07 ENCOUNTER — Telehealth: Payer: Self-pay | Admitting: Cardiology

## 2016-12-07 DIAGNOSIS — I471 Supraventricular tachycardia: Secondary | ICD-10-CM

## 2016-12-07 NOTE — Telephone Encounter (Signed)
Will you please advise 

## 2016-12-07 NOTE — Telephone Encounter (Signed)
S/w patient. She said at last OV with Dr Alvino ChapelIngal on 12/01/16, Dr Alvino ChapelIngal said for her to Take Toprol XL 25mg  by mouth daily and may take an extra 0.5-1 tablet at night as needed. Patient has occasionally taken an extra dose in the evening, about 3 times of the last 2 weeks. Patient originally prescribed medication by Dr Jason CoopArnett. Dr Jason CoopArnett told pt to f/u with Cardiology for further management. Routing to Dr Alvino ChapelIngal to clarify prescription and ok for us to refill for patient.

## 2016-12-07 NOTE — Telephone Encounter (Signed)
°*  STAT* If patient is at the pharmacy, call can be transferred to refill team.   1. Which medications need to be refilled? (please list name of each medication and dose if known) Metoprolol 25 mg po daily  PATIENT SAYS INGAL TOLD HER TO TAKE 1-2 TABLETS DAILY SHE IS RUNNING OUT OF RX TOO SOON WITH THIS CURRENT DOSAGE  2. Which pharmacy/location (including street and city if local pharmacy) is medication to be sent to? [facility] [address]   3. Do they need a 30 day or 90 day supply? 90

## 2016-12-08 MED ORDER — METOPROLOL SUCCINATE ER 25 MG PO TB24: 25.0000 mg | ORAL_TABLET | Freq: Every day | ORAL | 0 refills | Status: DC

## 2016-12-08 NOTE — Telephone Encounter (Signed)
Based on holter no episodes of svt. As discussed on last visit, she may need zio patch/long term monitor. This might be good idea before we change med doses. She can be prescribed original dose if she needs a refill for now. And then based on results of long term monitor, we can make adjustments.

## 2016-12-08 NOTE — Telephone Encounter (Signed)
Spoke with patient in detail and let her know that Dr. Alvino ChapelIngal recommends zio patch to evaluate her heart rate further. Let her know that I would send in one prescription and that once we receive monitor we can determine if we need to adjust her medication dosage. She was agreeable with plan and will come in now to have monitor placed. She verbalized understanding of our conversation, agreement with plan, and has no further questions at this time.

## 2016-12-10 ENCOUNTER — Telehealth: Payer: Self-pay

## 2016-12-10 NOTE — Telephone Encounter (Signed)
pt called states she is having really bad anxiety and panic attacks.  pt states that the prozac is not working the last 3-4 days she has been on edge.   (pt was seen on  12-02-16 )

## 2016-12-11 NOTE — Telephone Encounter (Signed)
Pt was called and told that she would need to make an  appt to be seen.

## 2016-12-11 NOTE — Telephone Encounter (Signed)
She will need to make an appointment

## 2016-12-14 NOTE — Telephone Encounter (Signed)
Okay 

## 2016-12-23 ENCOUNTER — Telehealth: Payer: Self-pay

## 2016-12-23 NOTE — Telephone Encounter (Signed)
pt called left message on voice mail that she is out of the trazodone. pt states that she has been taking two instead of one that at her last visit you told her that she could take 1-2 if she needed it and now she is out.    Pt was last seen on  12-02-16 next appt  12-30-16.

## 2016-12-24 ENCOUNTER — Other Ambulatory Visit: Payer: Self-pay | Admitting: Cardiology

## 2016-12-25 NOTE — Telephone Encounter (Signed)
Patient can continue to take the 50 mg at bedtime. We will discuss increasing the dose at her next appointment

## 2016-12-25 NOTE — Telephone Encounter (Signed)
TC to pt at 9:21 am pt was told to continue to take 50 mg at bedtime and that at the next appt will discuss increasing the dose. Marland Kitchen

## 2016-12-28 ENCOUNTER — Other Ambulatory Visit: Payer: Self-pay | Admitting: Psychiatry

## 2016-12-30 ENCOUNTER — Ambulatory Visit (INDEPENDENT_AMBULATORY_CARE_PROVIDER_SITE_OTHER): Payer: 59 | Admitting: Psychiatry

## 2016-12-30 ENCOUNTER — Encounter: Payer: Self-pay | Admitting: Psychiatry

## 2016-12-30 VITALS — BP 112/72 | HR 78 | Temp 98.4°F | Wt 152.8 lb

## 2016-12-30 DIAGNOSIS — F411 Generalized anxiety disorder: Secondary | ICD-10-CM

## 2016-12-30 DIAGNOSIS — F3130 Bipolar disorder, current episode depressed, mild or moderate severity, unspecified: Secondary | ICD-10-CM | POA: Diagnosis not present

## 2016-12-30 MED ORDER — FLUOXETINE HCL 40 MG PO CAPS: 40.0000 mg | ORAL_CAPSULE | Freq: Every day | ORAL | 1 refills | Status: DC

## 2016-12-30 MED ORDER — TRAZODONE HCL 100 MG PO TABS
100.0000 mg | ORAL_TABLET | Freq: Every day | ORAL | 1 refills | Status: DC
Start: 2016-12-30 — End: 2017-03-08

## 2016-12-30 NOTE — Progress Notes (Signed)
Psychiatric progress note  Patient Identification: Madeline White MRN:  [id number] Date of Evaluation:  12/30/2016 Referral Source:Margaret Arnett NP Chief Complaint:  Increased anxiety and depression Chief Complaint    Follow-up; Medication Refill     Visit Diagnosis:    ICD-9-CM ICD-10-CM   1. GAD (generalized anxiety disorder) 300.02 F41.1   2. Bipolar affective disorder, current episode depressed, current episode severity unspecified (HCC) 296.50 F31.30     History of Present Illness:  Patient was seen for a follow-up of her anxiety today.  States over the past 2 weeks, she has been depressed, easily overwhelmed and paranoid when in public. She feels people are watching her when out in public.   Easily irritated. States that her children have ADHD and that's also very bothersome to her. Reports her husband and mom are quite supportive. However over the last 2 weeks things have not been well at all and reports that she feels like staying in bed most of the time. Denies having any suicidal thoughts.   PHQ 9 of 14       Past Psychiatric History: None  Previous Psychotropic Medications: Zoloft, Prozac  Substance Abuse History in the last 12 months:  No.  Consequences of Substance Abuse: Negative  Past Medical History:  Past Medical History:  Diagnosis Date  . Anxiety   . Bipolar affective disorder (HCC)   . Cardiac arrhythmia due to congenital heart disease   . GERD (gastroesophageal reflux disease)   . SVT (supraventricular tachycardia) (HCC) 2006   had been on metoprolol  but stopped taking    Past Surgical History:  Procedure Laterality Date  . CESAREAN SECTION    . GASTRIC BYPASS    . GASTRIC BYPASS      Family Psychiatric History: none  Family History:  Family History  Problem Relation Age of Onset  . Hyperlipidemia Mother   . Diabetes Mother   . Hyperlipidemia Father   . Heart disease Maternal Grandmother   . Diabetes Maternal Grandmother   . Heart  disease Maternal Grandfather   . Diabetes Maternal Grandfather   . Heart disease Paternal Grandmother   . Diabetes Paternal Grandmother   . Alcohol abuse Paternal Grandfather   . Heart disease Paternal Grandfather   . Diabetes Paternal Grandfather     Social History:   Social History   Social History  . Marital status: Married    Spouse name: N/A  . Number of children: N/A  . Years of education: N/A   Social History Main Topics  . Smoking status: Never Smoker  . Smokeless tobacco: Never Used  . Alcohol use No  . Drug use: No  . Sexual activity: Yes    Birth control/ protection: IUD   Other Topics Concern  . None   Social History Narrative   From [place] and grew up here .      Married.      Just moved to from [facility].       2 children, 1yrs- boy and 15 months- girl      Staying home.       Husband is Vet and has PTSD.       Caring for mother.       Had been a CMA at [employer].     Additional Social History:   Allergies:   Allergies  Allergen Reactions  . Penicillins Hives    Metabolic Disorder Labs: No results found for: HGBA1C, MPG No results found for: PROLACTIN No results found  for: CHOL, TRIG, HDL, CHOLHDL, VLDL, LDLCALC   Current Medications: Current Outpatient Prescriptions  Medication Sig Dispense Refill  . aspirin 81 MG chewable tablet Chew by mouth daily.    . Biotin 1000 MCG tablet Take 1,000 mcg by mouth 3 (three) times daily.    . cholecalciferol (VITAMIN D) 1000 units tablet Take 2,000 Units by mouth daily.    . clotrimazole (LOTRIMIN) 1 % cream Apply 1 application topically 2 (two) times daily. 30 g 1  . cyanocobalamin 2000 MCG tablet Take 2,500 mcg by mouth daily.    . cyclobenzaprine (FLEXERIL) 10 MG tablet Take 1 tablet (10 mg total) by mouth at bedtime as needed for muscle spasms. 14 tablet 0  . diclofenac sodium (VOLTAREN) 1 % GEL Apply 4 g topically 4 (four) times daily. 1 Tube 3  . famotidine (PEPCID) 20 MG tablet Take 20 mg  by mouth daily.    Marland Kitchen FLUoxetine (PROZAC) 20 MG capsule Take 1 capsule (20 mg total) by mouth daily. 30 capsule 2  . hydrOXYzine (ATARAX/VISTARIL) 10 MG tablet Take 1 tablet (10 mg total) by mouth 2 (two) times daily as needed. 30 tablet 0  . metoprolol succinate (TOPROL-XL) 25 MG 24 hr tablet TAKE ONE TABLET BY MOUTH EVERY DAY 30 tablet 0  . traMADol (ULTRAM) 50 MG tablet Take one half to one tablet as needed for pain. 30 tablet 0  . traZODone (DESYREL) 50 MG tablet Take 1 tablet (50 mg total) by mouth at bedtime. 30 tablet 1   No current facility-administered medications for this visit.     Neurologic: Headache: No Seizure: No Paresthesias:No  Musculoskeletal: Strength & Muscle Tone: within normal limits Gait & Station: normal Patient leans: N/A  Psychiatric Specialty Exam: ROS  Blood pressure 112/72, pulse 78, temperature 98.4 F (36.9 C), temperature source Oral, weight 152 lb 12.8 oz (69.3 kg).Body mass index is 24.66 kg/m.  General Appearance: Casual  Eye Contact:  Fair  Speech:  Clear and Coherent  Volume:  Normal  Mood:  anxious  Affect:  Congruent  Thought Process:  Coherent  Orientation:  Full (Time, Place, and Person)  Thought Content:  Logical  Suicidal Thoughts:  No  Homicidal Thoughts:  No  Memory:  Immediate;   Fair Recent;   Fair Remote;   Fair  Judgement:  Fair  Insight:  Fair  Psychomotor Activity:  Normal  Concentration:  Concentration: Fair and Attention Span: Fair  Recall:  [employer] of Knowledge:Fair  Language: Fair  Akathisia:  No  Handed:  Right  AIMS (if indicated):  na  Assets:  Communication Skills Desire for Improvement Financial Resources/Insurance Physical Health Social Support  ADL's:  Intact  Cognition: WNL  Sleep:  Fair with trazodone     Treatment Plan Summary   Generalized anxiety disorder Increase Prozac to 40 mg once daily.  Insomnia Increase the trazodone to 100 mg at bedtime.  Recommend that patient start  to see a therapist on a weekly basis and address any triggers for her anxiety. She is to follow-up with Dr. Elna Breslow for evaluation for ADHD.  Return to clinic in 4 weeks time or call before if needed    Patrick North, MD 4/11/201812:59 PM

## 2017-01-04 ENCOUNTER — Encounter: Payer: Self-pay | Admitting: Family

## 2017-01-05 ENCOUNTER — Other Ambulatory Visit: Payer: Self-pay | Admitting: Family

## 2017-01-05 DIAGNOSIS — M545 Low back pain, unspecified: Secondary | ICD-10-CM

## 2017-01-06 MED ORDER — CYCLOBENZAPRINE HCL 10 MG PO TABS: 10.0000 mg | ORAL_TABLET | Freq: Every evening | ORAL | 0 refills | Status: DC | PRN

## 2017-01-10 ENCOUNTER — Emergency Department
Admission: EM | Admit: 2017-01-10 | Discharge: 2017-01-10 | Disposition: A | Discharge status: Home / Self Care | Payer: 59 | Attending: Emergency Medicine | Admitting: Emergency Medicine

## 2017-01-10 ENCOUNTER — Emergency Department: Payer: 59

## 2017-01-10 ENCOUNTER — Encounter: Payer: Self-pay | Admitting: Emergency Medicine

## 2017-01-10 DIAGNOSIS — Z7982 Long term (current) use of aspirin: Secondary | ICD-10-CM | POA: Insufficient documentation

## 2017-01-10 DIAGNOSIS — M545 Low back pain: Secondary | ICD-10-CM | POA: Insufficient documentation

## 2017-01-10 DIAGNOSIS — G8929 Other chronic pain: Secondary | ICD-10-CM | POA: Insufficient documentation

## 2017-01-10 DIAGNOSIS — H6503 Acute serous otitis media, bilateral: Secondary | ICD-10-CM | POA: Diagnosis not present

## 2017-01-10 LAB — POCT PREGNANCY, URINE: PREG TEST UR: NEGATIVE

## 2017-01-10 MED ORDER — PREDNISONE 10 MG PO TABS: 10.0000 mg | ORAL_TABLET | Freq: Every day | ORAL | 0 refills | Status: DC

## 2017-01-10 MED ORDER — DEXAMETHASONE SODIUM PHOSPHATE 10 MG/ML IJ SOLN
10.0000 mg | Freq: Once | INTRAMUSCULAR | Status: AC
  Administered 2017-01-10: 10 mg via INTRAMUSCULAR
  Filled 2017-01-10: qty 1

## 2017-01-10 NOTE — ED Triage Notes (Signed)
Pt reports back pain for the past several weeks states yesterday she started having ringing in her ears.

## 2017-01-10 NOTE — ED Notes (Signed)
See triage note  States she has had back pain for several months  Went to PCP .Also went to PT   But states pain is worse   Unable to sit for any length of time   Or pick up her child

## 2017-01-10 NOTE — Discharge Instructions (Signed)
Please take medications as prescribed. Recommend following up with specialists, please keep your appointment. Return to ER for any worsening symptoms urgent changes in her health.

## 2017-01-10 NOTE — ED Provider Notes (Signed)
[facility]-EMERGENCY DEPARTMENT Provider Note   CSN: [id number] Arrival date & time: 01/10/17  1138     History   Chief Complaint Chief Complaint  Patient presents with  . Back Pain  . Tinnitus    HPI Madeline White is a 31 y.o. female presents to the emergency department for evaluation of chronic lower back pain. Patient's head back pain for several months recently increased over the last few weeks. She describes aching pain along the midline of her lumbar spine with no numbness tingling or radicular signs. She denies any trauma or injury. Patient describes soreness and stiffness when she first gets out of the bed. No fevers. She also describes fullness in her left ear with ringing. She has decreased hearing in the left ear and she has had some mild congestion. She is not taking any medications.  HPI  Past Medical History:  Diagnosis Date  . Anxiety   . Bipolar affective disorder (HCC)   . Cardiac arrhythmia due to congenital heart disease   . GERD (gastroesophageal reflux disease)   . SVT (supraventricular tachycardia) (HCC) 2006   had been on metoprolol  but stopped taking    Patient Active Problem List   Diagnosis Date Noted  . Rash and nonspecific skin eruption 10/01/2016  . Midline low back pain without sciatica 10/01/2016  . Palpitations 08/24/2016  . Bipolar affective (HCC) 08/12/2016  . Encounter to establish care 08/12/2016    Past Surgical History:  Procedure Laterality Date  . CESAREAN SECTION    . GASTRIC BYPASS    . GASTRIC BYPASS      OB History    No data available       Home Medications    Prior to Admission medications   Medication Sig Start Date End Date Taking? Authorizing Provider  aspirin 81 MG chewable tablet Chew by mouth daily.    Historical Provider, MD  Biotin 1000 MCG tablet Take 1,000 mcg by mouth 3 (three) times daily.    Historical Provider, MD  cholecalciferol (VITAMIN D) 1000 units tablet Take 2,000 Units by mouth daily.     Historical Provider, MD  clotrimazole (LOTRIMIN) 1 % cream Apply 1 application topically 2 (two) times daily. 10/01/16   Allegra Grana, FNP  cyanocobalamin 2000 MCG tablet Take 2,500 mcg by mouth daily.    Historical Provider, MD  cyclobenzaprine (FLEXERIL) 10 MG tablet Take 1 tablet (10 mg total) by mouth at bedtime as needed for muscle spasms. 01/06/17   Allegra Grana, FNP  diclofenac sodium (VOLTAREN) 1 % GEL Apply 4 g topically 4 (four) times daily. 10/01/16   Allegra Grana, FNP  famotidine (PEPCID) 20 MG tablet Take 20 mg by mouth daily.    Historical Provider, MD  FLUoxetine (PROZAC) 40 MG capsule Take 1 capsule (40 mg total) by mouth daily. 12/30/16 12/30/17  Himabindu Ravi, MD  hydrOXYzine (ATARAX/VISTARIL) 10 MG tablet Take 1 tablet (10 mg total) by mouth 2 (two) times daily as needed. 10/28/16   Himabindu Ravi, MD  metoprolol succinate (TOPROL-XL) 25 MG 24 hr tablet TAKE ONE TABLET BY MOUTH EVERY DAY 12/24/16   Almond Lint, MD  predniSONE (DELTASONE) 10 MG tablet Take 1 tablet (10 mg total) by mouth daily. 6,5,4,3,2,1 six day taper 01/10/17   Evon Slack, PA-C  traMADol (ULTRAM) 50 MG tablet Take one half to one tablet as needed for pain. 12/03/16   Allegra Grana, FNP  traZODone (DESYREL) 100 MG tablet Take 1 tablet (100 mg  total) by mouth at bedtime. 12/30/16   Patrick North, MD    Family History Family History  Problem Relation Age of Onset  . Hyperlipidemia Mother   . Diabetes Mother   . Hyperlipidemia Father   . Heart disease Maternal Grandmother   . Diabetes Maternal Grandmother   . Heart disease Maternal Grandfather   . Diabetes Maternal Grandfather   . Heart disease Paternal Grandmother   . Diabetes Paternal Grandmother   . Alcohol abuse Paternal Grandfather   . Heart disease Paternal Grandfather   . Diabetes Paternal Grandfather     Social History Social History  Substance Use Topics  . Smoking status: Never Smoker  . Smokeless tobacco: Never Used  .  Alcohol use No     Allergies   Penicillins   Review of Systems Review of Systems  Constitutional: Negative for activity change, chills, fatigue and fever.  HENT: Positive for ear pain, hearing loss and rhinorrhea. Negative for congestion, sinus pressure and sore throat.   Eyes: Negative for visual disturbance.  Respiratory: Negative for cough, chest tightness and shortness of breath.   Cardiovascular: Negative for chest pain and leg swelling.  Gastrointestinal: Negative for abdominal pain, diarrhea, nausea and vomiting.  Genitourinary: Negative for dysuria.  Musculoskeletal: Positive for back pain. Negative for arthralgias and gait problem.  Skin: Negative for rash.  Neurological: Negative for weakness, numbness and headaches.  Hematological: Negative for adenopathy.  Psychiatric/Behavioral: Negative for agitation, behavioral problems and confusion.     Physical Exam Updated Vital Signs BP 123/77   Pulse 84   Temp 98.4 F (36.9 C) (Oral)   Resp 18   Ht  (1.676 m)   Wt 68 kg   SpO2 100%   BMI 24.21 kg/m   Physical Exam  Constitutional: She is oriented to person, place, and time. She appears well-developed and well-nourished. No distress.  HENT:  Head: Normocephalic and atraumatic.  Right Ear: External ear and ear canal normal. Tympanic membrane is not injected, not perforated, not erythematous, not retracted and not bulging. A middle ear effusion is present. No decreased hearing is noted.  Left Ear: External ear and ear canal normal. Tympanic membrane is not injected, not perforated, not erythematous, not retracted and not bulging. A middle ear effusion is present. No decreased hearing is noted.  Mouth/Throat: Oropharynx is clear and moist.  Eyes: EOM are normal. Pupils are equal, round, and reactive to light. Right eye exhibits no discharge. Left eye exhibits no discharge.  Neck: Normal range of motion. Neck supple.  Cardiovascular: Normal rate, regular rhythm and  intact distal pulses.   Pulmonary/Chest: Effort normal and breath sounds normal. No respiratory distress. She exhibits no tenderness.  Abdominal: Soft. She exhibits no distension. There is no tenderness.  Musculoskeletal: Normal range of motion. She exhibits no edema.  Lumbar Spine: Examination of the lumbar spine reveals no bony abnormality, no edema, and no ecchymosis.  There is no step off.  The patient has full range of motion of the lumbar spine with flexion and extension.  The patient has normal lateral bend and rotation.  The patient has no pain with range of motion activities.  The patient is non tender along the spinous process.  The patient is tender along the lumbosacral paravertebral muscles left and right, with no muscle spasms.  The patient is non tender along the iliac crest.  The patient is non tender in the sciatic notch.  The patient is non tender along the Sacroiliac joint.  There is no Coccyx joint tenderness.    Bilateral Lower Extremities: Examination of the lower extremities reveals no bony abnormality, no edema, and no ecchymosis.  The patient has full active and passive range of motion of the hips, knees, and ankles.  There is no discomfort with range of motion exercises.  The patient is non tender along the greater trochanter region.  The patient has a negative Denna Haggard' test bilaterally.  There is normal skin warmth.  There is normal capillary refill bilaterally.    Neurologic: The patient has a negative straight leg raise.  The patient has normal muscle strength testing for the quadriceps, calves, ankle dorsiflexion, ankle plantarflexion, and extensor hallicus longus.  The patient has sensation that is intact to light touch.    Neurological: She is alert and oriented to person, place, and time. She has normal reflexes.  Skin: Skin is warm and dry.  Psychiatric: She has a normal mood and affect. Her behavior is normal. Thought content normal.     ED Treatments / Results    Labs (all labs ordered are listed, but only abnormal results are displayed) Labs Reviewed  POCT PREGNANCY, URINE    EKG  EKG Interpretation None       Radiology Dg Lumbar Spine Complete  Result Date: 01/10/2017 CLINICAL DATA:  Patient with intermittent low back pain for 1 year, worsening over the last 2-3 months. Initial encounter. EXAM: LUMBAR SPINE - COMPLETE 4+ VIEW COMPARISON:  None. FINDINGS: Normal anatomic alignment. Lower thoracic and lumbar spine degenerative disc disease most pronounced T11-12 and L1-2. Preservation of the vertebral body heights. No acute osseous abnormality. SI joints are unremarkable. Pelvic phleboliths. Intrauterine device identified. Stool throughout the colon. IMPRESSION: Lower thoracic and lumbar spine degenerative changes. No acute process. Electronically Signed   By: Annia Belt M.D.   On: 01/10/2017 14:20    Procedures Procedures (including critical care time)  Medications Ordered in ED Medications  dexamethasone (DECADRON) injection 10 mg (10 mg Intramuscular Given 01/10/17 1330)     Initial Impression / Assessment and Plan / ED Course  I have reviewed the triage vital signs and the nursing notes.  Pertinent labs & imaging results that were available during my care of the patient were reviewed by me and considered in my medical decision making (see chart for details).     31 year old female with congestion, bilateral ear serous otitis media without infection. She also has acute on chronic lower back pain x-ray showing mild degenerative disc disease. No neurological deficits or radicular symptoms. She is placed on a 6 day steroid taper. She is unable to take NSAIDs due to gastric bypass surgery. She will take over-the-counter decongestant medications. She is asking on signs and symptoms to return to the ED for.  Final Clinical Impressions(s) / ED Diagnoses   Final diagnoses:  Chronic midline low back pain without sciatica  Bilateral acute  serous otitis media, recurrence not specified    New Prescriptions New Prescriptions   PREDNISONE (DELTASONE) 10 MG TABLET    Take 1 tablet (10 mg total) by mouth daily. 6,5,4,3,2,1 six day taper     Evon Slack, PA-C 01/10/17 1503    Phineas Semen, MD 01/10/17 [id number]

## 2017-01-11 ENCOUNTER — Encounter: Payer: Self-pay | Admitting: Family

## 2017-01-11 ENCOUNTER — Other Ambulatory Visit: Payer: Self-pay | Admitting: Family

## 2017-01-11 DIAGNOSIS — M545 Low back pain, unspecified: Secondary | ICD-10-CM

## 2017-01-13 ENCOUNTER — Encounter: Payer: Self-pay | Admitting: Psychiatry

## 2017-01-13 ENCOUNTER — Ambulatory Visit (INDEPENDENT_AMBULATORY_CARE_PROVIDER_SITE_OTHER): Payer: 59 | Admitting: Psychiatry

## 2017-01-13 VITALS — BP 119/73 | HR 76 | Temp 98.9°F | Wt 160.0 lb

## 2017-01-13 DIAGNOSIS — F3130 Bipolar disorder, current episode depressed, mild or moderate severity, unspecified: Secondary | ICD-10-CM | POA: Diagnosis not present

## 2017-01-13 DIAGNOSIS — F411 Generalized anxiety disorder: Secondary | ICD-10-CM

## 2017-01-13 MED ORDER — DULOXETINE HCL 20 MG PO CPEP: 20.0000 mg | ORAL_CAPSULE | Freq: Every day | ORAL | 1 refills | Status: DC

## 2017-01-13 MED ORDER — FLUOXETINE HCL 20 MG PO CAPS: ORAL_CAPSULE | ORAL | 0 refills | Status: DC

## 2017-01-13 NOTE — Progress Notes (Signed)
Psychiatric progress note  Patient Identification: Madeline White MRN:  [id number] Date of Evaluation:  01/13/2017 Referral Source:Margaret Arnett NP Chief Complaint:  Increased anxiety and depression Chief Complaint    Follow-up; Medication Refill     Visit Diagnosis:    ICD-9-CM ICD-10-CM   1. GAD (generalized anxiety disorder) 300.02 F41.1   2. Bipolar affective disorder, current episode depressed, current episode severity unspecified (HCC) 296.50 F31.30     History of Present Illness:  Patient was seen for a follow-up of her anxiety today.  Reports tolerating the Prozac well at , not as easily aggravated. Continues to feel depressed and anxious. Has no energy, not motivated at all. She has degenerative disc disease and was started on steroid taper.  Reports that her pain is bad and could be contributing to increased anxiety and stress. Denies suicidal thoughts. States her PCP is wary about prescribing opiates for the pain and also tramadol. She reports having to go the er for her pain.  Past Psychiatric History: None  Previous Psychotropic Medications: Zoloft, Prozac, abilify, seroquel  Substance Abuse History in the last 12 months:  No.  Consequences of Substance Abuse: Negative  Past Medical History:  Past Medical History:  Diagnosis Date  . Anxiety   . Bipolar affective disorder (HCC)   . Cardiac arrhythmia due to congenital heart disease   . GERD (gastroesophageal reflux disease)   . SVT (supraventricular tachycardia) (HCC) 2006   had been on metoprolol  but stopped taking    Past Surgical History:  Procedure Laterality Date  . CESAREAN SECTION    . GASTRIC BYPASS    . GASTRIC BYPASS      Family Psychiatric History: none  Family History:  Family History  Problem Relation Age of Onset  . Hyperlipidemia Mother   . Diabetes Mother   . Hyperlipidemia Father   . Heart disease Maternal Grandmother   . Diabetes Maternal Grandmother   . Heart disease  Maternal Grandfather   . Diabetes Maternal Grandfather   . Heart disease Paternal Grandmother   . Diabetes Paternal Grandmother   . Alcohol abuse Paternal Grandfather   . Heart disease Paternal Grandfather   . Diabetes Paternal Grandfather     Social History:   Social History   Social History  . Marital status: Married    Spouse name: N/A  . Number of children: N/A  . Years of education: N/A   Social History Main Topics  . Smoking status: Never Smoker  . Smokeless tobacco: Never Used  . Alcohol use No  . Drug use: No  . Sexual activity: Yes    Birth control/ protection: IUD   Other Topics Concern  . None   Social History Narrative   From [place] and grew up here .      Married.      Just moved to from [facility].       2 children, 81yrs- boy and 15 months- girl      Staying home.       Husband is Vet and has PTSD.       Caring for mother.       Had been a CMA at [employer].     Additional Social History:   Allergies:   Allergies  Allergen Reactions  . Penicillins Hives    Metabolic Disorder Labs: No results found for: HGBA1C, MPG No results found for: PROLACTIN No results found for: CHOL, TRIG, HDL, CHOLHDL, VLDL, LDLCALC   Current Medications: Current Outpatient Prescriptions  Medication Sig Dispense Refill  . Biotin 1000 MCG tablet Take 1,000 mcg by mouth 3 (three) times daily.    . cholecalciferol (VITAMIN D) 1000 units tablet Take 2,000 Units by mouth daily.    . clotrimazole (LOTRIMIN) 1 % cream Apply 1 application topically 2 (two) times daily. 30 g 1  . cyanocobalamin 2000 MCG tablet Take 2,500 mcg by mouth daily.    . famotidine (PEPCID) 20 MG tablet Take 20 mg by mouth daily.    Marland Kitchen FLUoxetine (PROZAC) 40 MG capsule Take 1 capsule (40 mg total) by mouth daily. 30 capsule 1  . hydrOXYzine (ATARAX/VISTARIL) 10 MG tablet Take 1 tablet (10 mg total) by mouth 2 (two) times daily as needed. 30 tablet 0  . metoprolol succinate (TOPROL-XL) 25 MG 24 hr  tablet TAKE ONE TABLET BY MOUTH EVERY DAY 30 tablet 0  . predniSONE (DELTASONE) 10 MG tablet Take 1 tablet (10 mg total) by mouth daily. 6,5,4,3,2,1 six day taper 21 tablet 0  . traMADol (ULTRAM) 50 MG tablet Take one half to one tablet as needed for pain. 30 tablet 0  . traZODone (DESYREL) 100 MG tablet Take 1 tablet (100 mg total) by mouth at bedtime. 30 tablet 1  . aspirin 81 MG chewable tablet Chew by mouth daily.    . cyclobenzaprine (FLEXERIL) 10 MG tablet Take 1 tablet (10 mg total) by mouth at bedtime as needed for muscle spasms. (Patient not taking: Reported on 01/13/2017) 14 tablet 0  . diclofenac sodium (VOLTAREN) 1 % GEL Apply 4 g topically 4 (four) times daily. (Patient not taking: Reported on 01/13/2017) 1 Tube 3   No current facility-administered medications for this visit.     Neurologic: Headache: No Seizure: No Paresthesias:No  Musculoskeletal: Strength & Muscle Tone: within normal limits Gait & Station: normal Patient leans: N/A  Psychiatric Specialty Exam: ROS  Blood pressure 119/73, pulse 76, temperature 98.9 F (37.2 C), temperature source Oral, weight 160 lb (72.6 kg).Body mass index is 25.82 kg/m.  General Appearance: Casual  Eye Contact:  Fair  Speech:  Clear and Coherent  Volume:  Normal  Mood:  Anxious, depressed  Affect:  Congruent  Thought Process:  Coherent  Orientation:  Full (Time, Place, and Person)  Thought Content:  Logical  Suicidal Thoughts:  No  Homicidal Thoughts:  No  Memory:  Immediate;   Fair Recent;   Fair Remote;   Fair  Judgement:  Fair  Insight:  Fair  Psychomotor Activity:  Normal  Concentration:  Concentration: Fair and Attention Span: Fair  Recall:  [employer] of Knowledge:Fair  Language: Fair  Akathisia:  No  Handed:  Right  AIMS (if indicated):  na  Assets:  Communication Skills Desire for Improvement Financial Resources/Insurance Physical Health Social Support  ADL's:  Intact  Cognition: WNL  Sleep:  Fair with  trazodone     Treatment Plan Summary   Generalized anxiety disorder Decrease Prozac to 20 mg once daily for 1 week and then stop. Start Cymbalta at  po qam. Patient was educated about the interactions between tramadol and Cymbalta and Prozac and the risk of serotonin syndrome. She reported that she is currently not taking any tramadol.  Insomnia Continue the trazodone at 100 mg at bedtime.  Recommend that patient start to see a therapist on a weekly basis and address any triggers for her anxiety. She is to follow-up with Dr. Elna Breslow for evaluation for ADHD.  Return to clinic in 3 weeks time or  call before if needed     Patrick North, MD 4/25/201811:04 AM

## 2017-01-14 ENCOUNTER — Encounter: Payer: Self-pay | Admitting: Family

## 2017-01-14 ENCOUNTER — Ambulatory Visit (INDEPENDENT_AMBULATORY_CARE_PROVIDER_SITE_OTHER): Payer: 59 | Admitting: Family

## 2017-01-14 VITALS — BP 126/70 | HR 94 | Temp 97.7°F | Ht 66.0 in | Wt 164.0 lb

## 2017-01-14 DIAGNOSIS — R41840 Attention and concentration deficit: Secondary | ICD-10-CM

## 2017-01-14 DIAGNOSIS — M545 Low back pain, unspecified: Secondary | ICD-10-CM

## 2017-01-14 DIAGNOSIS — F988 Other specified behavioral and emotional disorders with onset usually occurring in childhood and adolescence: Secondary | ICD-10-CM | POA: Insufficient documentation

## 2017-01-14 DIAGNOSIS — F3131 Bipolar disorder, current episode depressed, mild: Secondary | ICD-10-CM | POA: Diagnosis not present

## 2017-01-14 DIAGNOSIS — R002 Palpitations: Secondary | ICD-10-CM | POA: Diagnosis not present

## 2017-01-14 HISTORY — DX: Attention and concentration deficit: R41.840

## 2017-01-14 NOTE — Progress Notes (Signed)
Subjective:    Patient ID: Madeline White, female    DOB: Feb 17, 1986, 31 y.o.   MRN: [id number]  CC: Madeline White is a 31 y.o. female who presents today for follow up.   HPI: Here for ED follow up 4 days ago. Has low back pain  Has been taking tyelonol for low back pain. No numbness or tingling in legs. Pain is worse after walking. Improves when still for long periods of time. Radiates up back to shoulder blades and to right hip. `Able to sleep on right hip.   Bipolar - Continues to follow with Behavioral health. Coming off prozac. Started on cymbalta.   Has gained 16 pounds in past month. No formal exercise. Considered joining [employer].   Notices HR increases in late afternoon. Would like to try higher dose metoprolol. Denies exertional chest pain or pressure, numbness or tingling radiating to left arm or jaw, dizziness, frequent headaches, changes in vision, or shortness of breath.   Complains of trouble concentrating for past several months, worsening. Affects her ability to complete tasks at home.  would like ADD testing. She had ADD in highschool and hasn't been on medication for many years          DG lumbar degenerative disc disease T11-12, L1-2. No acute process HISTORY:  Past Medical History:  Diagnosis Date  . Anxiety   . Bipolar affective disorder (HCC)   . Cardiac arrhythmia due to congenital heart disease   . GERD (gastroesophageal reflux disease)   . SVT (supraventricular tachycardia) (HCC) 2006   had been on metoprolol  but stopped taking   Past Surgical History:  Procedure Laterality Date  . CESAREAN SECTION    . GASTRIC BYPASS    . GASTRIC BYPASS     Family History  Problem Relation Age of Onset  . Hyperlipidemia Mother   . Diabetes Mother   . Hyperlipidemia Father   . Heart disease Maternal Grandmother   . Diabetes Maternal Grandmother   . Heart disease Maternal Grandfather   . Diabetes Maternal Grandfather   . Heart disease Paternal  Grandmother   . Diabetes Paternal Grandmother   . Alcohol abuse Paternal Grandfather   . Heart disease Paternal Grandfather   . Diabetes Paternal Grandfather     Allergies: Penicillins Current Outpatient Prescriptions on File Prior to Visit  Medication Sig Dispense Refill  . aspirin 81 MG chewable tablet Chew by mouth daily.    . Biotin 1000 MCG tablet Take 1,000 mcg by mouth 3 (three) times daily.    . cholecalciferol (VITAMIN D) 1000 units tablet Take 2,000 Units by mouth daily.    . clotrimazole (LOTRIMIN) 1 % cream Apply 1 application topically 2 (two) times daily. 30 g 1  . cyanocobalamin 2000 MCG tablet Take 2,500 mcg by mouth daily.    . cyclobenzaprine (FLEXERIL) 10 MG tablet Take 1 tablet (10 mg total) by mouth at bedtime as needed for muscle spasms. 14 tablet 0  . diclofenac sodium (VOLTAREN) 1 % GEL Apply 4 g topically 4 (four) times daily. 1 Tube 3  . DULoxetine (CYMBALTA) 20 MG capsule Take 1 capsule (20 mg total) by mouth daily. 30 capsule 1  . famotidine (PEPCID) 20 MG tablet Take 20 mg by mouth daily.    Marland Kitchen FLUoxetine (PROZAC) 20 MG capsule Take 1 tablet daily for 1 week and then stop. 7 capsule 0  . hydrOXYzine (ATARAX/VISTARIL) 10 MG tablet Take 1 tablet (10 mg total) by mouth 2 (two) times  daily as needed. 30 tablet 0  . metoprolol succinate (TOPROL-XL) 25 MG 24 hr tablet TAKE ONE TABLET BY MOUTH EVERY DAY 30 tablet 0  . predniSONE (DELTASONE) 10 MG tablet Take 1 tablet (10 mg total) by mouth daily. 6,5,4,3,2,1 six day taper 21 tablet 0  . traZODone (DESYREL) 100 MG tablet Take 1 tablet (100 mg total) by mouth at bedtime. 30 tablet 1   No current facility-administered medications on file prior to visit.     Social History  Substance Use Topics  . Smoking status: Never Smoker  . Smokeless tobacco: Never Used  . Alcohol use No    Review of Systems  Constitutional: Negative for chills and fever.  Respiratory: Negative for cough.   Cardiovascular: Positive for  palpitations. Negative for chest pain.  Gastrointestinal: Negative for nausea and vomiting.  Musculoskeletal: Positive for back pain.  Neurological: Negative for dizziness and numbness.  Psychiatric/Behavioral: Negative for confusion.      Objective:    BP 126/70   Pulse 94   Temp 97.7 F (36.5 C) (Oral)   Ht  (1.676 m)   Wt 164 lb (74.4 kg)   SpO2 98%   BMI 26.47 kg/m  BP Readings from Last 3 Encounters:  01/14/17 126/70  01/10/17 120/70  12/01/16 120/78   Wt Readings from Last 3 Encounters:  01/14/17 164 lb (74.4 kg)  01/10/17 150 lb (68 kg)  12/01/16 148 lb 8 oz (67.4 kg)    Physical Exam  Constitutional: She appears well-developed and well-nourished.  Eyes: Conjunctivae are normal.  Cardiovascular: Normal rate, regular rhythm, normal heart sounds and normal pulses.   Pulmonary/Chest: Effort normal and breath sounds normal. She has no wheezes. She has no rhonchi. She has no rales.  Musculoskeletal:       Lumbar back: She exhibits normal range of motion, no tenderness, no bony tenderness, no swelling, no edema, no pain and no spasm.  Full range of motion with flexion, tension, lateral side bends. No bony tenderness. No pain, numbness, tingling elicited with single leg raise bilaterally.   Neurological: She is alert. She has normal strength. No sensory deficit.  Reflex Scores:      Patellar reflexes are 2+ on the right side and 2+ on the left side. Sensation and strength intact bilateral lower extremities.  Skin: Skin is warm and dry.  Psychiatric: She has a normal mood and affect. Her speech is normal and behavior is normal. Thought content normal.  Vitals reviewed.      Assessment & Plan:   Problem List Items Addressed This Visit      Other   Bipolar affective (HCC)    Started on cymbalta. Continues to follow with behavioral health.       Palpitations    No CP. Trial increase beta blocker to BID.       Midline low back pain without sciatica -  Primary    Suspect DDD.  We also discussed at length that rapid weight gain, and depression likely exacerbating symptoms. Has started cymbalta and agreed that risks with combined tramadol are not appropriate at this time. Discontinued tramadol. We agreed on non pharmacologic therapy including exercise being most important tenants to treatment. She will see orthopedics in one month.       Poor concentration    Referral to dev psy for formal testing.       Relevant Orders   [facility]       I  have discontinued Ms. Topor traMADol. I am also having her maintain her clotrimazole, cholecalciferol, aspirin, cyanocobalamin, Biotin, diclofenac sodium, hydrOXYzine, famotidine, metoprolol succinate, traZODone, cyclobenzaprine, predniSONE, FLUoxetine, and DULoxetine.   No orders of the defined types were placed in this encounter.   Return precautions given.   Risks, benefits, and alternatives of the medications and treatment plan prescribed today were discussed, and patient expressed understanding.   Education regarding symptom management and diagnosis given to patient on AVS.  Continue to follow with Rennie Plowman, FNP for routine health maintenance.   Luanna Salk and I agreed with plan.   Rennie Plowman, FNP

## 2017-01-14 NOTE — Patient Instructions (Addendum)
Start Exercise program as discussed.   Let me know if you would like referral to nutrition at any point.  Heat , aspercreme, thermacare for back  Stop tramadol while on cymbalta.   Trial 25 mg metoprolol in the morning and another at night. Monitor heart rate and blood pressure.  Let me know if dizzy or if heart rate < 60 or Blood pressure < 100/ 65-- that is too low and would then go back to 25 mg once per day.

## 2017-01-14 NOTE — Progress Notes (Signed)
Pre visit review using our clinic review tool, if applicable. No additional management support is needed unless otherwise documented below in the visit note. 

## 2017-01-15 ENCOUNTER — Encounter: Payer: Self-pay | Admitting: Family

## 2017-01-15 ENCOUNTER — Other Ambulatory Visit: Payer: Self-pay | Admitting: Family

## 2017-01-15 DIAGNOSIS — M545 Low back pain, unspecified: Secondary | ICD-10-CM

## 2017-01-15 NOTE — Assessment & Plan Note (Signed)
Referral to dev psy for formal testing.

## 2017-01-15 NOTE — Assessment & Plan Note (Signed)
No CP. Trial increase beta blocker to BID.

## 2017-01-15 NOTE — Assessment & Plan Note (Signed)
Started on cymbalta. Continues to follow with behavioral health.

## 2017-01-15 NOTE — Assessment & Plan Note (Signed)
Suspect DDD.  We also discussed at length that rapid weight gain, and depression likely exacerbating symptoms. Has started cymbalta and agreed that risks with combined tramadol are not appropriate at this time. Discontinued tramadol. We agreed on non pharmacologic therapy including exercise being most important tenants to treatment. She will see orthopedics in one month.

## 2017-01-17 ENCOUNTER — Encounter: Payer: Self-pay | Admitting: Family

## 2017-01-18 ENCOUNTER — Ambulatory Visit
Admission: RE | Admit: 2017-01-18 | Discharge: 2017-01-18 | Disposition: A | Discharge status: Home / Self Care | Payer: 59 | Source: Ambulatory Visit | Attending: Family | Admitting: Family

## 2017-01-18 ENCOUNTER — Encounter: Payer: Self-pay | Admitting: Family

## 2017-01-18 ENCOUNTER — Ambulatory Visit (INDEPENDENT_AMBULATORY_CARE_PROVIDER_SITE_OTHER): Payer: 59 | Admitting: Family

## 2017-01-18 ENCOUNTER — Telehealth: Payer: Self-pay | Admitting: Family

## 2017-01-18 VITALS — BP 136/78 | HR 78 | Temp 98.1°F | Ht 66.0 in | Wt 160.0 lb

## 2017-01-18 DIAGNOSIS — K8020 Calculus of gallbladder without cholecystitis without obstruction: Secondary | ICD-10-CM | POA: Insufficient documentation

## 2017-01-18 DIAGNOSIS — R1011 Right upper quadrant pain: Secondary | ICD-10-CM | POA: Diagnosis not present

## 2017-01-18 DIAGNOSIS — R110 Nausea: Secondary | ICD-10-CM

## 2017-01-18 LAB — URINALYSIS, ROUTINE W REFLEX MICROSCOPIC
BILIRUBIN URINE: NEGATIVE
Hgb urine dipstick: NEGATIVE
KETONES UR: NEGATIVE
Leukocytes, UA: NEGATIVE
NITRITE: NEGATIVE
PH: 6.5 (ref 5.0–8.0)
Specific Gravity, Urine: 1.025 (ref 1.000–1.030)
TOTAL PROTEIN, URINE-UPE24: NEGATIVE
UROBILINOGEN UA: 0.2 (ref 0.0–1.0)
Urine Glucose: NEGATIVE

## 2017-01-18 LAB — COMPREHENSIVE METABOLIC PANEL
ALT: 67 U/L — ABNORMAL HIGH (ref 0–35)
AST: 16 U/L (ref 0–37)
Albumin: 3.8 g/dL (ref 3.5–5.2)
Alkaline Phosphatase: 42 U/L (ref 39–117)
BUN: 18 mg/dL (ref 6–23)
CHLORIDE: 104 meq/L (ref 96–112)
CO2: 28 mEq/L (ref 19–32)
Calcium: 9.1 mg/dL (ref 8.4–10.5)
Creatinine, Ser: 0.69 mg/dL (ref 0.40–1.20)
GFR: 106.57 mL/min (ref >60.00)
GLUCOSE: 80 mg/dL (ref 70–99)
POTASSIUM: 3.8 meq/L (ref 3.5–5.1)
Sodium: 139 mEq/L (ref 135–145)
TOTAL PROTEIN: 6.2 g/dL (ref 6.0–8.3)
Total Bilirubin: 0.3 mg/dL (ref 0.2–1.2)

## 2017-01-18 LAB — CBC WITH DIFFERENTIAL/PLATELET
BASOS PCT: 0.6 % (ref 0.0–3.0)
Basophils Absolute: 0.1 10*3/uL (ref 0.0–0.1)
EOS ABS: 0.1 10*3/uL (ref 0.0–0.7)
EOS PCT: 1 % (ref 0.0–5.0)
HEMATOCRIT: 34.4 % — AB (ref 36.0–46.0)
HEMOGLOBIN: 11.7 g/dL — AB (ref 12.0–15.0)
LYMPHS ABS: 2.9 10*3/uL (ref 0.7–4.0)
Lymphocytes Relative: 35 % (ref 12.0–46.0)
MCHC: 33.9 g/dL (ref 30.0–36.0)
MCV: 88.7 fl (ref 78.0–100.0)
Monocytes Absolute: 0.6 10*3/uL (ref 0.1–1.0)
Monocytes Relative: 6.8 % (ref 3.0–12.0)
NEUTROS ABS: 4.6 10*3/uL (ref 1.4–7.7)
Neutrophils Relative %: 56.6 % (ref 43.0–77.0)
Platelets: 353 10*3/uL (ref 150.0–400.0)
RBC: 3.88 Mil/uL (ref 3.87–5.11)
RDW: 14.9 % (ref 11.5–15.5)
WBC: 8.2 10*3/uL (ref 4.0–10.5)

## 2017-01-18 LAB — LIPASE: Lipase: 39 U/L (ref 11.0–59.0)

## 2017-01-18 MED ORDER — ONDANSETRON 4 MG PO TBDP: 4.0000 mg | ORAL_TABLET | Freq: Three times a day (TID) | ORAL | 0 refills | Status: DC | PRN

## 2017-01-18 MED ORDER — CYCLOBENZAPRINE HCL 10 MG PO TABS: 10.0000 mg | ORAL_TABLET | Freq: Every evening | ORAL | 0 refills | Status: DC | PRN

## 2017-01-18 NOTE — Patient Instructions (Signed)
Labs today  Ultrasound  Concern for gallbladder etiology  Please keep phone with you today

## 2017-01-18 NOTE — Telephone Encounter (Signed)
Patient was informed of results.  Patient understood and no questions, comments, or concerns at this time.  

## 2017-01-18 NOTE — Telephone Encounter (Signed)
FYI: Scheduled patient for 1:15. Today.

## 2017-01-18 NOTE — Telephone Encounter (Signed)
Call pt  RUQ US shows 12 gallstones; gallbladder duct is dilated which makes me even more suspect that gallstones are problem   labs  ( when back) will help differentiate and further understand source of pain  I went ahead and placed referral to GI as they make go ahead and recommend surgery  zofran sent in for nausea

## 2017-01-18 NOTE — Progress Notes (Signed)
Subjective:    Patient ID: Madeline White, female    DOB: 13-Mar-1986, 31 y.o.   MRN: [id number]  CC: Madeline White is a 31 y.o. female who presents today for an acute visit.    HPI: CC: RUQ pain 5 days, waxing and waning. Pain there all the time, however 'dulls out'.  Endorses nausea, loss of appetite. Pain after eating and drinking cold water 'almost immediately'. Describes as stabbing, twisting pain.   Tried pepcid, tums with no relief. No epigastric burning, belching, vomiting, fever, dysuria.  Last bm today.    h/o weightloss surgery  2 cesarens  No h/o diverticulitis.     Last ate 1030        HISTORY:  Past Medical History:  Diagnosis Date  . Anxiety   . Bipolar affective disorder (HCC)   . Cardiac arrhythmia due to congenital heart disease   . GERD (gastroesophageal reflux disease)   . SVT (supraventricular tachycardia) (HCC) 2006   had been on metoprolol  but stopped taking   Past Surgical History:  Procedure Laterality Date  . CESAREAN SECTION    . GASTRIC BYPASS    . GASTRIC BYPASS     Family History  Problem Relation Age of Onset  . Hyperlipidemia Mother   . Diabetes Mother   . Hyperlipidemia Father   . Heart disease Maternal Grandmother   . Diabetes Maternal Grandmother   . Heart disease Maternal Grandfather   . Diabetes Maternal Grandfather   . Heart disease Paternal Grandmother   . Diabetes Paternal Grandmother   . Alcohol abuse Paternal Grandfather   . Heart disease Paternal Grandfather   . Diabetes Paternal Grandfather     Allergies: Penicillins Current Outpatient Prescriptions on File Prior to Visit  Medication Sig Dispense Refill  . aspirin 81 MG chewable tablet Chew by mouth daily.    . Biotin 1000 MCG tablet Take 1,000 mcg by mouth 3 (three) times daily.    . cholecalciferol (VITAMIN D) 1000 units tablet Take 2,000 Units by mouth daily.    . clotrimazole (LOTRIMIN) 1 % cream Apply 1 application topically 2 (two) times  daily. 30 g 1  . cyanocobalamin 2000 MCG tablet Take 2,500 mcg by mouth daily.    . cyclobenzaprine (FLEXERIL) 10 MG tablet Take 1 tablet (10 mg total) by mouth at bedtime as needed for muscle spasms. 30 tablet 0  . diclofenac sodium (VOLTAREN) 1 % GEL Apply 4 g topically 4 (four) times daily. 1 Tube 3  . DULoxetine (CYMBALTA) 20 MG capsule Take 1 capsule (20 mg total) by mouth daily. 30 capsule 1  . famotidine (PEPCID) 20 MG tablet Take 20 mg by mouth daily.    Marland Kitchen FLUoxetine (PROZAC) 20 MG capsule Take 1 tablet daily for 1 week and then stop. 7 capsule 0  . hydrOXYzine (ATARAX/VISTARIL) 10 MG tablet Take 1 tablet (10 mg total) by mouth 2 (two) times daily as needed. 30 tablet 0  . metoprolol succinate (TOPROL-XL) 25 MG 24 hr tablet TAKE ONE TABLET BY MOUTH EVERY DAY 30 tablet 0  . predniSONE (DELTASONE) 10 MG tablet Take 1 tablet (10 mg total) by mouth daily. 6,5,4,3,2,1 six day taper 21 tablet 0  . traZODone (DESYREL) 100 MG tablet Take 1 tablet (100 mg total) by mouth at bedtime. 30 tablet 1   No current facility-administered medications on file prior to visit.     Social History  Substance Use Topics  . Smoking status: Never Smoker  . Smokeless tobacco:  Never Used  . Alcohol use No    Review of Systems  Constitutional: Negative for chills and fever.  Respiratory: Negative for cough and shortness of breath.   Cardiovascular: Negative for chest pain and palpitations.  Gastrointestinal: Positive for abdominal pain and nausea. Negative for abdominal distention, constipation and vomiting.  Genitourinary: Negative for dysuria and hematuria.      Objective:    There were no vitals taken for this visit.   Physical Exam  Constitutional: She appears well-developed and well-nourished.  Eyes: Conjunctivae are normal.  Cardiovascular: Normal rate, regular rhythm, normal heart sounds and normal pulses.   Pulmonary/Chest: Effort normal and breath sounds normal. She has no wheezes. She has  no rhonchi. She has no rales.  Abdominal: Soft. Normal appearance and bowel sounds are normal. She exhibits no distension, no fluid wave, no ascites and no mass. There is tenderness. There is positive Murphy's sign. There is no rigidity, no rebound, no guarding, no CVA tenderness and no tenderness at McBurney's point.  Neg heel jar test  Neurological: She is alert.  Skin: Skin is warm and dry.  Psychiatric: She has a normal mood and affect. Her speech is normal and behavior is normal. Thought content normal.  Vitals reviewed.      Assessment & Plan:   1. RUQ pain Concern for acute cholelithiasis especially in context of weight loss surgery. Pending stat ultrasound, labs.  - US Abdomen Limited RUQ - Comprehensive metabolic panel - CBC with Differential/Platelet - Lipase - Urinalysis, Routine w reflex microscopic - POCT urinalysis dipstick    I am having Ms. Dirden maintain her clotrimazole, cholecalciferol, aspirin, cyanocobalamin, Biotin, diclofenac sodium, hydrOXYzine, famotidine, metoprolol succinate, traZODone, predniSONE, FLUoxetine, DULoxetine, and cyclobenzaprine.   No orders of the defined types were placed in this encounter.   Return precautions given.   Risks, benefits, and alternatives of the medications and treatment plan prescribed today were discussed, and patient expressed understanding.   Education regarding symptom management and diagnosis given to patient on AVS.  Continue to follow with Rennie Plowman, FNP for routine health maintenance.   Luanna Salk and I agreed with plan.   Rennie Plowman, FNP

## 2017-01-19 ENCOUNTER — Encounter: Payer: Self-pay | Admitting: Family

## 2017-01-20 ENCOUNTER — Other Ambulatory Visit: Payer: Self-pay | Admitting: Family

## 2017-01-20 DIAGNOSIS — K219 Gastro-esophageal reflux disease without esophagitis: Secondary | ICD-10-CM | POA: Insufficient documentation

## 2017-01-20 MED ORDER — METOPROLOL SUCCINATE ER 25 MG PO TB24: 25.0000 mg | ORAL_TABLET | Freq: Two times a day (BID) | ORAL | 3 refills | Status: DC

## 2017-01-20 MED ORDER — PANTOPRAZOLE SODIUM 20 MG PO TBEC: 20.0000 mg | DELAYED_RELEASE_TABLET | Freq: Every day | ORAL | 1 refills | Status: DC

## 2017-01-21 ENCOUNTER — Other Ambulatory Visit: Payer: Self-pay | Admitting: Family

## 2017-01-21 DIAGNOSIS — R110 Nausea: Secondary | ICD-10-CM

## 2017-01-22 NOTE — Telephone Encounter (Signed)
Pt called back following up on this. Can we please resend. Thank you!  Pharmacy - [facility] [place] - [id number]

## 2017-01-22 NOTE — Telephone Encounter (Signed)
I have a HUGE favor! Madeline White (my daughter) got a hold of my Zofran and threw it in the toilet. It didn't come in a bottle but in the peel apart tablets. Is there anyway I can get another prescription sent I had only taken 3 out of the 20 even if I have to pay out of pocket I will cause it helped TREMENDOUSLY, I was actually able to eat. Thanks again.

## 2017-01-24 MED ORDER — ONDANSETRON 4 MG PO TBDP: 4.0000 mg | ORAL_TABLET | Freq: Three times a day (TID) | ORAL | 0 refills | Status: DC | PRN

## 2017-01-25 NOTE — Telephone Encounter (Signed)
Script already refilled

## 2017-01-26 ENCOUNTER — Other Ambulatory Visit: Payer: Self-pay

## 2017-01-26 ENCOUNTER — Ambulatory Visit (INDEPENDENT_AMBULATORY_CARE_PROVIDER_SITE_OTHER): Payer: 59 | Admitting: Gastroenterology

## 2017-01-26 ENCOUNTER — Encounter: Payer: Self-pay | Admitting: *Deleted

## 2017-01-26 VITALS — BP 110/72 | HR 81 | Temp 98.5°F | Ht 66.0 in | Wt 152.4 lb

## 2017-01-26 DIAGNOSIS — R112 Nausea with vomiting, unspecified: Secondary | ICD-10-CM

## 2017-01-26 DIAGNOSIS — R110 Nausea: Secondary | ICD-10-CM

## 2017-01-26 DIAGNOSIS — K838 Other specified diseases of biliary tract: Secondary | ICD-10-CM | POA: Diagnosis not present

## 2017-01-26 MED ORDER — ONDANSETRON HCL 4 MG PO TABS: 4.0000 mg | ORAL_TABLET | Freq: Four times a day (QID) | ORAL | 1 refills | Status: DC | PRN

## 2017-01-26 NOTE — Patient Instructions (Addendum)
You are scheduled for an [facility] ([facility]. You were given instructions today. Please contact our office with any questions.  You have been scheduled [facility] an MR[facility] at [facility] on Saturday, May 19th @ 2:00pm. Please arrive at 1:30pm and check in at the medical mall. You cannot have anything to eat or drink 4 hours prior to scan. If you need to reschedule the procedure for any reason, Please contact central scheduling at [phone].

## 2017-01-26 NOTE — Progress Notes (Signed)
Gastroenterology Consultation  Referring Provider:     Allegra Grana, FNP Primary Care Physician:  Allegra Grana, FNP Primary Gastroenterologist:  Dr. Servando Snare     Reason for Consultation:     Nausea        HPI:   Madeline White is a 31 y.o. y/o female referred for consultation & management of Nausea by Dr. Jason Coop, Lyn Records, FNP.  This patient comes today with a history of nausea. The patient was also found to have a slightly low hemoglobin and increase ALT at 67. The patient also had an ultrasound that showed multiple gallstones with a mildly dilated common bile duct at 7-8 mm. The patient's lipase was normal. The patient's hemoglobin was noted to be 11.7 at the last CBC. The patient reports that she is lost 20 pounds recently due to her nausea.  The patient states that she has abdominal pain and nausea whenever she eats.  She reported to be with both food and water.  Past Medical History:  Diagnosis Date  . Anxiety   . Bipolar affective disorder (HCC)   . Cardiac arrhythmia due to congenital heart disease   . GERD (gastroesophageal reflux disease)   . SVT (supraventricular tachycardia) (HCC) 2006   had been on metoprolol 10mg  but stopped taking    Past Surgical History:  Procedure Laterality Date  . CESAREAN SECTION    . GASTRIC BYPASS    . GASTRIC BYPASS      Prior to Admission medications   Medication Sig Start Date End Date Taking? Authorizing Provider  aspirin 81 MG chewable tablet Chew by mouth daily.    [provider]  Biotin 1000 MCG tablet Take 1,000 mcg by mouth 3 (three) times daily.    [provider]  cholecalciferol (VITAMIN D) 1000 units tablet Take 2,000 Units by mouth daily.    [provider]  clotrimazole (LOTRIMIN) 1 % cream Apply 1 application topically 2 (two) times daily. 10/01/16   Allegra Grana, FNP  cyanocobalamin 2000 MCG tablet Take 2,500 mcg by mouth daily.    [provider]  cyclobenzaprine  (FLEXERIL) 10 MG tablet Take 1 tablet (10 mg total) by mouth at bedtime as needed for muscle spasms. 01/18/17   Allegra Grana, FNP  diclofenac sodium (VOLTAREN) 1 % GEL Apply 4 g topically 4 (four) times daily. 10/01/16   Allegra Grana, FNP  DULoxetine (CYMBALTA) 20 MG capsule Take 1 capsule (20 mg total) by mouth daily. 01/13/17 01/13/18  Patrick North, MD  famotidine (PEPCID) 20 MG tablet Take 20 mg by mouth daily.    [provider]  FLUoxetine (PROZAC) 20 MG capsule Take 1 tablet daily for 1 week and then stop. 01/13/17   Patrick North, MD  hydrOXYzine (ATARAX/VISTARIL) 10 MG tablet Take 1 tablet (10 mg total) by mouth 2 (two) times daily as needed. 10/28/16   Patrick North, MD  metoprolol succinate (TOPROL-XL) 25 MG 24 hr tablet Take 1 tablet (25 mg total) by mouth 2 (two) times daily. 01/20/17   Allegra Grana, FNP  ondansetron (ZOFRAN ODT) 4 MG disintegrating tablet Take 1 tablet (4 mg total) by mouth every 8 (eight) hours as needed for nausea or vomiting. 01/24/17   Allegra Grana, FNP  pantoprazole (PROTONIX) 20 MG tablet Take 1 tablet (20 mg total) by mouth daily. 01/20/17   Allegra Grana, FNP  predniSONE (DELTASONE) 10 MG tablet Take 1 tablet (10 mg total) by mouth daily. 6,5,4,3,2,1  six day taper 01/10/17   Evon SlackGaines, Thomas C, PA-C  traZODone (DESYREL) 100 MG tablet Take 1 tablet (100 mg total) by mouth at bedtime. 12/30/16   Patrick Northavi, Himabindu, MD    Family History  Problem Relation Age of Onset  . Hyperlipidemia Mother   . Diabetes Mother   . Hyperlipidemia Father   . Heart disease Maternal Grandmother   . Diabetes Maternal Grandmother   . Heart disease Maternal Grandfather   . Diabetes Maternal Grandfather   . Heart disease Paternal Grandmother   . Diabetes Paternal Grandmother   . Alcohol abuse Paternal Grandfather   . Heart disease Paternal Grandfather   . Diabetes Paternal Grandfather      Social History  Substance Use Topics  . Smoking status: Never  Smoker  . Smokeless tobacco: Never Used  . Alcohol use No    Allergies as of 01/26/2017 - Review Complete 01/18/2017  Allergen Reaction Noted  . Penicillins Hives 08/12/2016    Review of Systems:    All systems reviewed and negative except where noted in HPI.   Physical Exam:  There were no vitals taken for this visit. No LMP recorded. Patient is not currently having periods (Reason: IUD). Psych:  Alert and cooperative. Normal mood and affect. General:   Alert,  Well-developed, well-nourished, pleasant and cooperative in NAD Head:  Normocephalic and atraumatic. Eyes:  Sclera clear, no icterus.   Conjunctiva pink. Ears:  Normal auditory acuity. Nose:  No deformity, discharge, or lesions. Mouth:  No deformity or lesions,oropharynx pink & moist. Neck:  Supple; no masses or thyromegaly. Lungs:  Respirations even and unlabored.  Clear throughout to auscultation.   No wheezes, crackles, or rhonchi. No acute distress. Heart:  Regular rate and rhythm; no murmurs, clicks, rubs, or gallops. Abdomen:  Normal bowel sounds.  No bruits.  Soft, non-tender and non-distended without masses, hepatosplenomegaly or hernias noted.  No guarding or rebound tenderness.  Negative Carnett sign.   Rectal:  Deferred.  Msk:  Symmetrical without gross deformities.  Good, equal movement & strength bilaterally. Pulses:  Normal pulses noted. Extremities:  No clubbing or edema.  No cyanosis. Neurologic:  Alert and oriented x3;  grossly normal neurologically. Skin:  Intact without significant lesions or rashes.  No jaundice. Lymph Nodes:  No significant cervical adenopathy. Psych:  Alert and cooperative. Normal mood and affect.  Imaging Studies: Dg Lumbar Spine Complete  Result Date: 01/10/2017 CLINICAL DATA:  Patient with intermittent low back pain for 1 year, worsening over the last 2-3 months. Initial encounter. EXAM: LUMBAR SPINE - COMPLETE 4+ VIEW COMPARISON:  None. FINDINGS: Normal anatomic alignment.  Lower thoracic and lumbar spine degenerative disc disease most pronounced T11-12 and L1-2. Preservation of the vertebral body heights. No acute osseous abnormality. SI joints are unremarkable. Pelvic phleboliths. Intrauterine device identified. Stool throughout the colon. IMPRESSION: Lower thoracic and lumbar spine degenerative changes. No acute process. Electronically Signed   By: Annia Beltrew  Davis M.D.   On: 01/10/2017 14:20   [place] Abdomen Limited Ruq  Result Date: 01/18/2017 CLINICAL DATA:  RIGHT upper quadrant pain, nausea for 4 days EXAM: US ABDOMEN LIMITED - RIGHT UPPER QUADRANT COMPARISON:  None. FINDINGS: Gallbladder: Gallbladder is filled with multiple shadowing gallstones measuring approximately 810 mm each. There approximately 12 gallstones. No gallbladder wall thickening or pericholecystic fluid. Negative sonographic Murphy's side. Common bile duct: Diameter: Minimally dilated at 7 to 8 mm. Liver: No focal lesion identified. Within normal limits in parenchymal echogenicity. IMPRESSION: 1. Multiple gallstones without evidence  acute cholecystitis. 2. Dilatation of the common bile duct. 3. Recommend correlation with serum bilirubin levels. Electronically Signed   By: Genevive Bi M.D.   On: 01/18/2017 16:23    Assessment and Plan:   Madeline White is a 31 y.o. y/o female who comes in today with a history of gastric bypass surgery and recently having nausea with abdominal pain.  The patient was found to have a slightly dilated common bile duct and multiple stones in her gallbladder without any signs of acute cholecystitis. The patient will be set up for a MRCP and an EGD to rule out an anastomotic ulcer as the cause of her nausea and vomiting.  The patient will also be started on Zofran tablets since she states that the dissolving tabs tastes poorly. I have discussed risks & benefits which include, but are not limited to, bleeding, infection, perforation & drug reaction.  The patient agrees with this  plan & written consent will be obtained.       Midge Minium, MD. Clementeen Graham   Note: This dictation was prepared with Dragon dictation along with smaller phrase technology. Any transcriptional errors that result from this process are unintentional.

## 2017-01-28 ENCOUNTER — Ambulatory Visit: Payer: Commercial Managed Care - HMO | Admitting: Anesthesiology

## 2017-01-28 ENCOUNTER — Ambulatory Visit
Admission: RE | Admit: 2017-01-28 | Discharge: 2017-01-28 | Disposition: A | Discharge status: Home / Self Care | Payer: Commercial Managed Care - HMO | Source: Ambulatory Visit | Attending: Gastroenterology | Admitting: Gastroenterology

## 2017-01-28 ENCOUNTER — Encounter
Admission: RE | Disposition: A | Discharge status: Home / Self Care | Payer: Self-pay | Source: Ambulatory Visit | Attending: Gastroenterology

## 2017-01-28 DIAGNOSIS — Z79899 Other long term (current) drug therapy: Secondary | ICD-10-CM | POA: Insufficient documentation

## 2017-01-28 DIAGNOSIS — F419 Anxiety disorder, unspecified: Secondary | ICD-10-CM | POA: Insufficient documentation

## 2017-01-28 DIAGNOSIS — Z7982 Long term (current) use of aspirin: Secondary | ICD-10-CM | POA: Diagnosis not present

## 2017-01-28 DIAGNOSIS — K219 Gastro-esophageal reflux disease without esophagitis: Secondary | ICD-10-CM | POA: Insufficient documentation

## 2017-01-28 DIAGNOSIS — R112 Nausea with vomiting, unspecified: Secondary | ICD-10-CM

## 2017-01-28 DIAGNOSIS — Z9884 Bariatric surgery status: Secondary | ICD-10-CM | POA: Diagnosis not present

## 2017-01-28 DIAGNOSIS — K9589 Other complications of other bariatric procedure: Secondary | ICD-10-CM | POA: Diagnosis not present

## 2017-01-28 DIAGNOSIS — I471 Supraventricular tachycardia: Secondary | ICD-10-CM | POA: Diagnosis not present

## 2017-01-28 DIAGNOSIS — R1013 Epigastric pain: Secondary | ICD-10-CM | POA: Insufficient documentation

## 2017-01-28 HISTORY — DX: Family history of other specified conditions: Z84.89

## 2017-01-28 HISTORY — DX: Other intervertebral disc degeneration, lumbar region: M51.36

## 2017-01-28 HISTORY — DX: Unspecified osteoarthritis, unspecified site: M19.90

## 2017-01-28 HISTORY — DX: Other intervertebral disc degeneration, lumbar region without mention of lumbar back pain or lower extremity pain: M51.369

## 2017-01-28 HISTORY — PX: ESOPHAGOGASTRODUODENOSCOPY (EGD) WITH PROPOFOL: SHX5813

## 2017-01-28 SURGERY — ESOPHAGOGASTRODUODENOSCOPY (EGD) WITH PROPOFOL
Anesthesia: Monitor Anesthesia Care | Wound class: Clean Contaminated

## 2017-01-28 MED ORDER — LACTATED RINGERS IV SOLN
10.0000 mL/h | INTRAVENOUS | Status: DC
  Administered 2017-01-28: 10 mL/h via INTRAVENOUS

## 2017-01-28 MED ORDER — GLYCOPYRROLATE 0.2 MG/ML IJ SOLN
INTRAMUSCULAR | Status: DC | PRN
  Administered 2017-01-28: 0.2 mg via INTRAVENOUS

## 2017-01-28 MED ORDER — PROPOFOL 10 MG/ML IV BOLUS
INTRAVENOUS | Status: DC | PRN
  Administered 2017-01-28: 10 mg via INTRAVENOUS
  Administered 2017-01-28: 80 mg via INTRAVENOUS
  Administered 2017-01-28: 30 mg via INTRAVENOUS
  Administered 2017-01-28: 20 mg via INTRAVENOUS
  Administered 2017-01-28: 30 mg via INTRAVENOUS

## 2017-01-28 MED ORDER — LIDOCAINE HCL (CARDIAC) 20 MG/ML IV SOLN
INTRAVENOUS | Status: DC | PRN
  Administered 2017-01-28: 50 mg via INTRAVENOUS

## 2017-01-28 SURGICAL SUPPLY — 32 items

## 2017-01-28 NOTE — H&P (Signed)
Madeline Minium, MD [facility] [address], [address] [place], [place] [address] Phone:[phone] Fax : [phone]  Primary Care Physician:  Allegra Grana, FNP Primary Gastroenterologist:  Dr. Servando Snare  Pre-Procedure History & Physical: HPI:  Madeline White is a 31 y.o. female is here for an endoscopy.   Past Medical History:  Diagnosis Date  . Anxiety   . Arthritis    lower back, knees  . Bipolar affective disorder (HCC)   . Cardiac arrhythmia due to congenital heart disease   . Degenerative disc disease, lumbar   . Family history of adverse reaction to anesthesia    Mopther - "aspirates every time"  . GERD (gastroesophageal reflux disease)   . SVT (supraventricular tachycardia) (HCC) 2006   had been on metoprolol 10mg  but stopped taking    Past Surgical History:  Procedure Laterality Date  . CESAREAN SECTION     x2  . GASTRIC BYPASS      Prior to Admission medications   Medication Sig Start Date End Date Taking? Authorizing Provider  aspirin 81 MG chewable tablet Chew by mouth daily.   Yes [provider]  Biotin 1000 MCG tablet Take 1,000 mcg by mouth 3 (three) times daily.   Yes [provider]  cholecalciferol (VITAMIN D) 1000 units tablet Take 2,000 Units by mouth daily.   Yes [provider]  cyanocobalamin 2000 MCG tablet Take 2,500 mcg by mouth daily.   Yes [provider]  DULoxetine (CYMBALTA) 20 MG capsule Take 1 capsule (20 mg total) by mouth daily. 01/13/17 01/13/18 Yes Ravi, Himabindu, MD  metoprolol succinate (TOPROL-XL) 25 MG 24 hr tablet Take 1 tablet (25 mg total) by mouth 2 (two) times daily. 01/20/17  Yes Allegra Grana, FNP  omeprazole (PRILOSEC) 20 MG capsule Take 20 mg by mouth daily.   Yes [provider]  ondansetron (ZOFRAN) 4 MG tablet Take 1 tablet (4 mg total) by mouth every 6 (six) hours as needed for nausea or vomiting. 01/26/17  Yes Madeline Minium, MD  pantoprazole (PROTONIX) 20 MG tablet Take 1  tablet (20 mg total) by mouth daily. 01/20/17  Yes Allegra Grana, FNP  traZODone (DESYREL) 100 MG tablet Take 1 tablet (100 mg total) by mouth at bedtime. 12/30/16  Yes Ravi, Himabindu, MD  cyclobenzaprine (FLEXERIL) 10 MG tablet Take 1 tablet (10 mg total) by mouth at bedtime as needed for muscle spasms. 01/18/17   Allegra Grana, FNP  famotidine (PEPCID) 20 MG tablet Take 20 mg by mouth daily.    [provider]  hydrOXYzine (ATARAX/VISTARIL) 10 MG tablet Take 1 tablet (10 mg total) by mouth 2 (two) times daily as needed. Patient not taking: Reported on 01/26/2017 10/28/16   Patrick North, MD  ondansetron (ZOFRAN ODT) 4 MG disintegrating tablet Take 1 tablet (4 mg total) by mouth every 8 (eight) hours as needed for nausea or vomiting. Patient not taking: Reported on 01/26/2017 01/24/17   Allegra Grana, FNP    Allergies as of 01/26/2017 - Review Complete 01/26/2017  Allergen Reaction Noted  . Penicillins Hives 08/12/2016    Family History  Problem Relation Age of Onset  . Hyperlipidemia Mother   . Diabetes Mother   . Hyperlipidemia Father   . Heart disease Maternal Grandmother   . Diabetes Maternal Grandmother   . Heart disease Maternal Grandfather   . Diabetes Maternal Grandfather   . Heart disease Paternal Grandmother   . Diabetes Paternal Grandmother   . Alcohol abuse Paternal  Grandfather   . Heart disease Paternal Grandfather   . Diabetes Paternal Grandfather     Social History   Social History  . Marital status: Married    Spouse name: N/A  . Number of children: N/A  . Years of education: N/A   Occupational History  . Not on file.   Social History Main Topics  . Smoking status: Never Smoker  . Smokeless tobacco: Never Used  . Alcohol use No  . Drug use: No  . Sexual activity: Yes    Birth control/ protection: IUD   Other Topics Concern  . Not on file   Social History Narrative   From [place] and grew up here .      Married.      [place]       2 children, 6831yrs- boy and 15 months- girl      Staying home.       Husband is Vet and has PTSD.       Caring for mother.       Had been a CMA at [employer].     Review of Systems: See HPI, otherwise negative ROS  Physical Exam: Ht 5\' 6"  (1.676 m)   Wt 152 lb (68.9 kg)   BMI 24.53 kg/m  General:   Alert,  pleasant and cooperative in NAD Head:  Normocephalic and atraumatic. Neck:  Supple; no masses or thyromegaly. Lungs:  Clear throughout to auscultation.    Heart:  Regular rate and rhythm. Abdomen:  Soft, nontender and nondistended. Normal bowel sounds, without guarding, and without rebound.   Neurologic:  Alert and  oriented x4;  grossly normal neurologically.  Impression/Plan: Madeline White is here for an endoscopy to be performed for epigastric pain  Risks, benefits, limitations, and alternatives regarding  endoscopy have been reviewed with the patient.  Questions have been answered.  All parties agreeable.   Madeline Miniumarren Elouise Divelbiss, MD  01/28/2017, 9:00 AM

## 2017-01-28 NOTE — Anesthesia Procedure Notes (Signed)
Procedure Name: MAC Performed by: Mayme Genta Pre-anesthesia Checklist: Patient identified, Emergency Drugs available, Suction available, Timeout performed and Patient being monitored Patient Re-evaluated:Patient Re-evaluated prior to inductionOxygen Delivery Method: Nasal cannula Placement Confirmation: positive ETCO2

## 2017-01-28 NOTE — Anesthesia Preprocedure Evaluation (Signed)
Anesthesia Evaluation    Reviewed: H&P , Patient's Chart, lab work & pertinent test results  Airway Mallampati: I       Dental  (+) Teeth Intact   Pulmonary neg pulmonary ROS,    breath sounds clear to auscultation       Cardiovascular + dysrhythmias (takes metoprolol) Supra Ventricular Tachycardia  Rhythm:regular Rate:Normal     Neuro/Psych PSYCHIATRIC DISORDERS (anxiety, bipolar disorder)    GI/Hepatic GERD  ,  Endo/Other    Renal/GU      Musculoskeletal   Abdominal   Peds  Hematology   Anesthesia Other Findings   Reproductive/Obstetrics                            Anesthesia Physical Anesthesia Plan  ASA: II  Anesthesia Plan: MAC   Post-op Pain Management:    Induction:   Airway Management Planned:   Additional Equipment:   Intra-op Plan:   Post-operative Plan:   Informed Consent: I have reviewed the patients History and Physical, chart, labs and discussed the procedure including the risks, benefits and alternatives for the proposed anesthesia with the patient or authorized representative who has indicated his/her understanding and acceptance.     Plan Discussed with: CRNA  Anesthesia Plan Comments:         Anesthesia Quick Evaluation

## 2017-01-28 NOTE — Op Note (Signed)
[facility] Patient Name: Madeline White Procedure Date: 01/28/2017 9:19 AM MRN: [id number] Account #: [id number] Date of Birth: 1985/10/21 Admit Type: Outpatient Age: 31 Room: [facility] OR ROOM 01 Gender: Female Note Status: Finalized Procedure:            Upper GI endoscopy Indications:          Nausea with vomiting Providers:            Midge Minium MD, MD Referring MD:         Lyn Records. Arnett (Referring MD) Medicines:            Propofol per Anesthesia Complications:        No immediate complications. Procedure:            Pre-Anesthesia Assessment:                       - Prior to the procedure, a History and Physical was                        performed, and patient medications and allergies were                        reviewed. The patient's tolerance of previous                        anesthesia was also reviewed. The risks and benefits of                        the procedure and the sedation options and risks were                        discussed with the patient. All questions were                        answered, and informed consent was obtained. Prior                        Anticoagulants: The patient has taken no previous                        anticoagulant or antiplatelet agents. ASA Grade                        Assessment: II - A patient with mild systemic disease.                        After reviewing the risks and benefits, the patient was                        deemed in satisfactory condition to undergo the                        procedure.                       After obtaining informed consent, the endoscope was                        passed under direct vision. Throughout the procedure,  the patient's blood pressure, pulse, and oxygen                        saturations were monitored continuously. The Olympus                        190 Endoscope [id number]) was introduced through the                         mouth, and advanced to the jejunum. The upper GI                        endoscopy was accomplished without difficulty. The                        patient tolerated the procedure well. Findings:      The examined esophagus was normal.      Evidence of a gastric bypass was found. A gastric pouch with a normal       size was found containing a foreign body and staples. The staple line       appeared intact. The gastrojejunal anastomosis was characterized by       healthy appearing mucosa. This was traversed. The pouch-to-jejunum limb       was characterized by healthy appearing mucosa. The duodenum-to-jejunum       limb was examined. Removal of a staple was accomplished with a regular       forceps.      The examined jejunum was normal. Impression:           - Normal esophagus.                       - Gastric bypass with a normal-sized pouch and intact                        staple line. Gastrojejunal anastomosis characterized by                        healthy appearing mucosa.                       - Normal examined jejunum. Recommendation:       - Discharge patient to home.                       - Resume previous diet.                       - Continue present medications. Procedure Code(s):    --- Professional ---                       [phone], Esophagogastroduodenoscopy, flexible, transoral;                        with removal of foreign body(s) Diagnosis Code(s):    --- Professional ---                       R11.2, Nausea with vomiting, unspecified                       Z98.84, Bariatric surgery status CPT copyright 2016 American Medical Association.  All rights reserved. The codes documented in this report are preliminary and upon coder review may  be revised to meet current compliance requirements. Midge Minium MD, MD 01/28/2017 9:46:05 AM This report has been signed electronically. Number of Addenda: 0 Note Initiated On: 01/28/2017 9:19 AM      [facility]

## 2017-01-28 NOTE — Transfer of Care (Signed)
Immediate Anesthesia Transfer of Care Note  Patient: Madeline White  Procedure(s) Performed: Procedure(s): ESOPHAGOGASTRODUODENOSCOPY (EGD) WITH PROPOFOL (N/A)  Patient Location: PACU  Anesthesia Type: MAC  Level of Consciousness: awake, alert  and patient cooperative  Airway and Oxygen Therapy: Patient Spontanous Breathing and Patient connected to supplemental oxygen  Post-op Assessment: Post-op Vital signs reviewed, Patient's Cardiovascular Status Stable, Respiratory Function Stable, Patent Airway and No signs of Nausea or vomiting  Post-op Vital Signs: Reviewed and stable  Complications: No apparent anesthesia complications

## 2017-01-28 NOTE — Anesthesia Postprocedure Evaluation (Signed)
Anesthesia Post Note  Patient: Madeline White  Procedure(s) Performed: Procedure(s) (LRB): ESOPHAGOGASTRODUODENOSCOPY (EGD) WITH PROPOFOL (N/A)  Patient location during evaluation: PACU Anesthesia Type: MAC Level of consciousness: awake and alert Pain management: pain level controlled Vital Signs Assessment: post-procedure vital signs reviewed and stable Respiratory status: spontaneous breathing, nonlabored ventilation and respiratory function stable Cardiovascular status: stable Postop Assessment: no signs of nausea or vomiting Anesthetic complications: no    Veda Canning

## 2017-01-29 ENCOUNTER — Encounter: Payer: Self-pay | Admitting: Gastroenterology

## 2017-02-01 ENCOUNTER — Encounter: Payer: Self-pay | Admitting: Family

## 2017-02-02 ENCOUNTER — Encounter: Payer: Self-pay | Admitting: Gastroenterology

## 2017-02-03 ENCOUNTER — Telehealth: Payer: Self-pay | Admitting: Gastroenterology

## 2017-02-03 ENCOUNTER — Other Ambulatory Visit: Payer: Self-pay | Admitting: Family

## 2017-02-03 ENCOUNTER — Ambulatory Visit: Payer: 59 | Admitting: Psychiatry

## 2017-02-03 DIAGNOSIS — M545 Low back pain, unspecified: Secondary | ICD-10-CM

## 2017-02-03 MED ORDER — PREDNISONE 10 MG PO TABS: ORAL_TABLET | ORAL | 0 refills | Status: DC

## 2017-02-03 NOTE — Telephone Encounter (Signed)
Patient having a lot of abdominal pain from gallstones. MRCP is on Saturday. What can she do for pain relief? Asprin is not helping.

## 2017-02-03 NOTE — Telephone Encounter (Signed)
Pt called office to get advice on pain control.  She stated that she is experiencing "unbearable pain and I do not want to go to ER to have them tell me I have 12 gallstones".  I've advised her after discussing with Dr. Tobi BastosAnna to go to Urgent Care.  Pt stated that she does not want to go to Urgent Care either.  I told her that I've advised her and will document that she stated that she does not want to go.

## 2017-02-04 ENCOUNTER — Other Ambulatory Visit: Payer: Self-pay | Admitting: Family

## 2017-02-04 DIAGNOSIS — R110 Nausea: Secondary | ICD-10-CM

## 2017-02-05 ENCOUNTER — Other Ambulatory Visit: Payer: Self-pay

## 2017-02-05 ENCOUNTER — Telehealth: Payer: Self-pay | Admitting: Gastroenterology

## 2017-02-05 ENCOUNTER — Telehealth: Payer: Self-pay | Admitting: Family

## 2017-02-05 DIAGNOSIS — R110 Nausea: Secondary | ICD-10-CM

## 2017-02-05 MED ORDER — ONDANSETRON 4 MG PO TBDP: 4.0000 mg | ORAL_TABLET | Freq: Three times a day (TID) | ORAL | 0 refills | Status: DC | PRN

## 2017-02-05 NOTE — Telephone Encounter (Signed)
*  STAT* If patient is at the pharmacy, call can be transferred to refill team.   1. Which medications need to be refilled? (please list name of each medication and dose if known) ondansetron (ZOFRAN) 4 MG tablet  2. Which pharmacy/location (including street and city if local pharmacy) is medication to be sent to? [facility]  3. Do they need a 30 day or 90 day supply? 90 day

## 2017-02-06 ENCOUNTER — Ambulatory Visit: Payer: 59

## 2017-02-06 ENCOUNTER — Ambulatory Visit
Admission: RE | Admit: 2017-02-06 | Discharge: 2017-02-06 | Disposition: A | Discharge status: Home / Self Care | Payer: Commercial Managed Care - HMO | Source: Ambulatory Visit | Attending: Gastroenterology | Admitting: Gastroenterology

## 2017-02-06 ENCOUNTER — Other Ambulatory Visit: Payer: Self-pay | Admitting: Gastroenterology

## 2017-02-06 DIAGNOSIS — K838 Other specified diseases of biliary tract: Secondary | ICD-10-CM | POA: Insufficient documentation

## 2017-02-06 DIAGNOSIS — Z9884 Bariatric surgery status: Secondary | ICD-10-CM | POA: Diagnosis not present

## 2017-02-06 DIAGNOSIS — K8020 Calculus of gallbladder without cholecystitis without obstruction: Secondary | ICD-10-CM | POA: Diagnosis not present

## 2017-02-06 MED ORDER — GADOBENATE DIMEGLUMINE 529 MG/ML IV SOLN
15.0000 mL | Freq: Once | INTRAVENOUS | Status: AC | PRN
  Administered 2017-02-06: 15 mL via INTRAVENOUS

## 2017-02-08 ENCOUNTER — Other Ambulatory Visit: Payer: Self-pay

## 2017-02-08 ENCOUNTER — Telehealth: Payer: Self-pay | Admitting: Gastroenterology

## 2017-02-08 ENCOUNTER — Other Ambulatory Visit: Payer: Self-pay | Admitting: Psychiatry

## 2017-02-08 MED ORDER — ONDANSETRON HCL 4 MG PO TABS: 4.0000 mg | ORAL_TABLET | Freq: Four times a day (QID) | ORAL | 1 refills | Status: DC | PRN

## 2017-02-08 NOTE — Telephone Encounter (Signed)
Pt notified rx was sent to pharmacy

## 2017-02-08 NOTE — Telephone Encounter (Signed)
[facility] Patient was called in Zofran but needs/wants to pill not dissolvable. Can you please resend that?

## 2017-02-09 ENCOUNTER — Telehealth: Payer: Self-pay

## 2017-02-09 ENCOUNTER — Other Ambulatory Visit: Payer: Self-pay

## 2017-02-09 DIAGNOSIS — K8000 Calculus of gallbladder with acute cholecystitis without obstruction: Secondary | ICD-10-CM

## 2017-02-09 NOTE — Telephone Encounter (Signed)
Noted  

## 2017-02-09 NOTE — Telephone Encounter (Signed)
Please advise for refill on zofran, thanks

## 2017-02-09 NOTE — Telephone Encounter (Signed)
I refilled yesterday If not, you may refill

## 2017-02-09 NOTE — Telephone Encounter (Signed)
-----   Message from Midge Miniumarren Wohl, MD sent at 02/08/2017  6:26 PM EDT ----- The patient know that her MRI showed a Common bile duct that was slightly dilated without any obstruction or stones seen.  There were multiple gallstones in the gallbladder for which she should contact surgery about.

## 2017-02-09 NOTE — Telephone Encounter (Signed)
Pt notified of MRI results and referral to general surgery for consultation.

## 2017-02-10 ENCOUNTER — Ambulatory Visit (INDEPENDENT_AMBULATORY_CARE_PROVIDER_SITE_OTHER): Payer: 59 | Admitting: Psychiatry

## 2017-02-10 ENCOUNTER — Other Ambulatory Visit: Payer: Self-pay | Admitting: Family

## 2017-02-10 ENCOUNTER — Encounter: Payer: Self-pay | Admitting: Psychiatry

## 2017-02-10 VITALS — BP 126/84 | HR 58 | Temp 97.7°F | Wt 161.8 lb

## 2017-02-10 DIAGNOSIS — F330 Major depressive disorder, recurrent, mild: Secondary | ICD-10-CM | POA: Diagnosis not present

## 2017-02-10 DIAGNOSIS — M545 Low back pain, unspecified: Secondary | ICD-10-CM

## 2017-02-10 DIAGNOSIS — F411 Generalized anxiety disorder: Secondary | ICD-10-CM | POA: Diagnosis not present

## 2017-02-10 MED ORDER — DULOXETINE HCL 30 MG PO CPEP: 30.0000 mg | ORAL_CAPSULE | Freq: Every day | ORAL | 1 refills | Status: DC

## 2017-02-10 NOTE — Progress Notes (Signed)
Psychiatric progress note  Patient Identification: Madeline White MRN:  [id number] Date of Evaluation:  02/10/2017 Referral Source:Margaret Arnett NP Chief Complaint:  Improved anxiety and mood Chief Complaint    Follow-up; Medication Refill     Visit Diagnosis:    ICD-9-CM ICD-10-CM   1. GAD (generalized anxiety disorder) 300.02 F41.1   2. 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 She has been able to taper off the Prozac without any problems. She is tolerating the Cymbalta well at 20 mg and states that his mood has improved quite a bit. She continues to struggle with low energy. States that she also has trouble concentrating on things. States that she does feel like she is moving slowly and the also feels fidgety quite often. Continues to feel somewhat depressed. Denies any suicidal thoughts. States that she has some family stressors with her parents that bother her. However she does agree that she let the stressors get to her and gets very down. She has not started to see a therapist.  Past Psychiatric History: None  Previous Psychotropic Medications: Zoloft, Prozac, abilify, seroquel  Substance Abuse History in the last 12 months:  No.  Consequences of Substance Abuse: Negative  Past Medical History:  Past Medical History:  Diagnosis Date  . Anxiety   . Arthritis    lower back, knees  . Bipolar affective disorder (HCC)   . Cardiac arrhythmia due to congenital heart disease   . Degenerative disc disease, lumbar   . Family history of adverse reaction to anesthesia    Mopther - "aspirates every time"  . GERD (gastroesophageal reflux disease)   . SVT (supraventricular tachycardia) (HCC) 2006   had been on metoprolol 10mg  but stopped taking    Past Surgical History:  Procedure Laterality Date  . CESAREAN SECTION     x2  . ESOPHAGOGASTRODUODENOSCOPY  (EGD) WITH PROPOFOL N/A 01/28/2017   Procedure: ESOPHAGOGASTRODUODENOSCOPY (EGD) WITH PROPOFOL;  Surgeon: Midge Minium, MD;  [facility];  Service: Endoscopy;  Laterality: N/A;  . GASTRIC BYPASS      Family Psychiatric History: none  Family History:  Family History  Problem Relation Age of Onset  . Hyperlipidemia Mother   . Diabetes Mother   . Hyperlipidemia Father   . Heart disease Maternal Grandmother   . Diabetes Maternal Grandmother   . Heart disease Maternal Grandfather   . Diabetes Maternal Grandfather   . Heart disease Paternal Grandmother   . Diabetes Paternal Grandmother   . Alcohol abuse Paternal Grandfather   . Heart disease Paternal Grandfather   . Diabetes Paternal Grandfather     Social History:   Social History   Social History  . Marital status: Married    Spouse name: N/A  . Number of children: N/A  . Years of education: N/A   Social History Main Topics  . Smoking status: Never Smoker  . Smokeless tobacco: Never Used  . Alcohol use No  . Drug use: No  . Sexual activity: Yes    Birth control/ protection: IUD   Other Topics Concern  . None   Social History Narrative   From [place] and grew up here .      Married.      Just moved to from [facility].       2 children, 66yrs- boy and 15 months- girl  Staying home.       Husband is Vet and has PTSD.       Caring for mother.       Had been a CMA at [employer].     Additional Social History:   Allergies:   Allergies  Allergen Reactions  . Penicillins Hives    Metabolic Disorder Labs: No results found for: HGBA1C, MPG No results found for: PROLACTIN No results found for: CHOL, TRIG, HDL, CHOLHDL, VLDL, LDLCALC   Current Medications: Current Outpatient Prescriptions  Medication Sig Dispense Refill  . aspirin 81 MG chewable tablet Chew by mouth daily.    . Biotin 1000 MCG tablet Take 1,000 mcg by mouth 3 (three) times daily.    . cholecalciferol (VITAMIN D) 1000  units tablet Take 2,000 Units by mouth daily.    . cyanocobalamin 2000 MCG tablet Take 2,500 mcg by mouth daily.    . cyclobenzaprine (FLEXERIL) 10 MG tablet Take 1 tablet (10 mg total) by mouth at bedtime as needed for muscle spasms. 30 tablet 0  . DULoxetine (CYMBALTA) 20 MG capsule Take 1 capsule (20 mg total) by mouth daily. 30 capsule 1  . famotidine (PEPCID) 20 MG tablet Take 20 mg by mouth daily.    . hydrOXYzine (ATARAX/VISTARIL) 10 MG tablet Take 1 tablet (10 mg total) by mouth 2 (two) times daily as needed. 30 tablet 0  . metoprolol succinate (TOPROL-XL) 25 MG 24 hr tablet Take 1 tablet (25 mg total) by mouth 2 (two) times daily. 60 tablet 3  . omeprazole (PRILOSEC) 20 MG capsule Take 20 mg by mouth daily.    . ondansetron (ZOFRAN ODT) 4 MG disintegrating tablet Take 1 tablet (4 mg total) by mouth every 8 (eight) hours as needed for nausea or vomiting. 20 tablet 0  . ondansetron (ZOFRAN) 4 MG tablet Take 1 tablet (4 mg total) by mouth every 6 (six) hours as needed for nausea or vomiting. 30 tablet 1  . pantoprazole (PROTONIX) 20 MG tablet Take 1 tablet (20 mg total) by mouth daily. 90 tablet 1  . predniSONE (DELTASONE) 10 MG tablet Take 4 tablets ( total 40 mg) by mouth for 2 days; take 3 tablets ( total 30 mg) by mouth for 2 days; take 2 tablets ( total 20 mg) by mouth for 1 day; take 1 tablet ( total 10 mg) by mouth for 1 day. 17 tablet 0  . traZODone (DESYREL) 100 MG tablet Take 1 tablet (100 mg total) by mouth at bedtime. 30 tablet 1   No current facility-administered medications for this visit.     Neurologic: Headache: No Seizure: No Paresthesias:No  Musculoskeletal: Strength & Muscle Tone: within normal limits Gait & Station: normal Patient leans: N/A  Psychiatric Specialty Exam: ROS  Blood pressure 126/84, pulse (!) 58, temperature 97.7 F (36.5 C), temperature source Oral, weight 161 lb 12.8 oz (73.4 kg).Body mass index is 26.12 kg/m.  General Appearance: Casual   Eye Contact:  Fair  Speech:  Clear and Coherent  Volume:  Normal  Mood: Slightly improved   Affect:  Congruent  Thought Process:  Coherent  Orientation:  Full (Time, Place, and Person)  Thought Content:  Logical  Suicidal Thoughts:  No  Homicidal Thoughts:  No  Memory:  Immediate;   Fair Recent;   Fair Remote;   Fair  Judgement:  Fair  Insight:  Fair  Psychomotor Activity:  Normal  Concentration:  Concentration: Fair and Attention Span: Fair  Recall:  [employer]  of Knowledge:Fair  Language: Fair  Akathisia:  No  Handed:  Right  AIMS (if indicated):  na  Assets:  Communication Skills Desire for Improvement Financial Resources/Insurance Physical Health Social Support  ADL's:  Intact  Cognition: WNL  Sleep:  Fair with trazodone 100mg     Treatment Plan Summary   Generalized anxiety disorder  Increase Cymbalta to 30mg  po qam.  Insomnia Continue the trazodone at 100 mg at bedtime.  Recommend that patient start to see a therapist on a weekly basis and address any triggers for her anxiety. She is to follow-up with Dr. Elna BreslowBruce Thompson for evaluation for ADHD.  Return to clinic in 4 weeks time or call before if needed     Patrick NorthAVI, Zola Runion, MD 5/23/201810:04 AM

## 2017-02-16 ENCOUNTER — Telehealth: Payer: Self-pay | Admitting: Family

## 2017-02-16 ENCOUNTER — Encounter: Payer: Self-pay | Admitting: Gastroenterology

## 2017-02-16 DIAGNOSIS — M545 Low back pain, unspecified: Secondary | ICD-10-CM

## 2017-02-16 NOTE — Telephone Encounter (Signed)
Patient stated that the pain is still the same as it was before. Patient is also scheduled to see Orthopedics at the end of June.  Last time she went to the ER,  Patient received steroid shot and prednisone taper.  Patient stated, that the taper was the only thing that has helped with her pain. Please advise.

## 2017-02-16 NOTE — Telephone Encounter (Signed)
Call pt  What is going on with her back? How is her pain? What has she tried?  She is requested prednisone

## 2017-02-17 MED ORDER — PREDNISONE 10 MG PO TABS: ORAL_TABLET | ORAL | 0 refills | Status: DC

## 2017-02-17 NOTE — Telephone Encounter (Signed)
Call pt and let her know we have sent rx

## 2017-02-17 NOTE — Telephone Encounter (Signed)
Patient has been notified

## 2017-02-18 ENCOUNTER — Ambulatory Visit (INDEPENDENT_AMBULATORY_CARE_PROVIDER_SITE_OTHER): Payer: 59 | Admitting: Surgery

## 2017-02-18 ENCOUNTER — Encounter: Payer: Self-pay | Admitting: Surgery

## 2017-02-18 ENCOUNTER — Telehealth: Payer: Self-pay

## 2017-02-18 ENCOUNTER — Encounter: Payer: Self-pay | Admitting: Family

## 2017-02-18 ENCOUNTER — Ambulatory Visit: Payer: Self-pay | Admitting: Surgery

## 2017-02-18 DIAGNOSIS — K8020 Calculus of gallbladder without cholecystitis without obstruction: Secondary | ICD-10-CM | POA: Diagnosis not present

## 2017-02-18 MED ORDER — DEXTROSE 5 % IV SOLN: 900.0000 mg | INTRAVENOUS | Status: AC

## 2017-02-18 NOTE — Telephone Encounter (Signed)
Medical and Cardiac Clearance faxed to DR.Rennie PlowmanMargaret Arnett at this time.

## 2017-02-18 NOTE — Progress Notes (Signed)
02/18/2017  Reason for Visit:  Symptomatic cholelithiasis  History of Present Illness: Madeline White is a 31 y.o. female who presents with a two-month history of right upper quadrant abdominal pain associated with nausea. Patient reports that she had a history of gastric bypass 2 years ago and has lost approximately 150 pounds. However 2 months ago she started developing nausea and right upper quadrant abdominal pain after eating. Now she is getting this after every meal and it does not matter what is she is eating. She denies having any fevers or chills. She initially went to her PCP and lab work was done as well as an ultrasound. The ultrasound showed cholelithiasis with a mildly dilated common bile duct but no evidence of choledocholithiasis. She was then seen by Dr. Servando Snare with gastroenterology and an EGD was obtained which was negative for any marginal ulcers. MRCP was then done which showed cholelithiasis with no choledocholithiasis and no evidence of cholecystitis. She now presents for evaluation for possible surgical management.  She describes the pain as stabbing at times but does subside. The pain is located in the right upper quadrant and does not radiate anywhere else. There are no other areas of abdominal pain. She has not had any vomiting but is doing dry heaving. She is taking Zofran which is helping her for her nausea control.  Past Medical History: Past Medical History:  Diagnosis Date  . Anxiety   . Arthritis    lower back, knees  . Bipolar affective (HCC) 08/12/2016  . Bipolar affective disorder (HCC)   . Cardiac arrhythmia due to congenital heart disease   . Degenerative disc disease, lumbar   . Family history of adverse reaction to anesthesia    Mopther - "aspirates every time"  . GERD (gastroesophageal reflux disease)   . Intractable vomiting with nausea   . Palpitations 08/24/2016  . Poor concentration 01/14/2017  . Rash and nonspecific skin eruption 10/01/2016  . Sleep  apnea 10/10/2013  . SVT (supraventricular tachycardia) (HCC) 2006   had been on metoprolol 10mg  but stopped taking     Past Surgical History: Past Surgical History:  Procedure Laterality Date  . CESAREAN SECTION     x2  . ESOPHAGOGASTRODUODENOSCOPY (EGD) WITH PROPOFOL N/A 01/28/2017   Procedure: ESOPHAGOGASTRODUODENOSCOPY (EGD) WITH PROPOFOL;  Surgeon: Midge Minium, MD;  Location: [facility];  Service: Endoscopy;  Laterality: N/A;  . GASTRIC BYPASS      Home Medications: Prior to Admission medications   Medication Sig Start Date End Date Taking? Authorizing Provider  aspirin 81 MG chewable tablet Chew by mouth daily.   Yes [provider]  Biotin 1000 MCG tablet Take 1,000 mcg by mouth 3 (three) times daily.   Yes [provider]  cholecalciferol (VITAMIN D) 1000 units tablet Take 2,000 Units by mouth daily.   Yes [provider]  cyanocobalamin 2000 MCG tablet Take 2,500 mcg by mouth daily.   Yes [provider]  cyclobenzaprine (FLEXERIL) 10 MG tablet Take 1 tablet (10 mg total) by mouth at bedtime as needed for muscle spasms. 01/18/17  Yes Arnett, Lyn Records, FNP  DULoxetine (CYMBALTA) 30 MG capsule Take 1 capsule (30 mg total) by mouth daily. 02/10/17 02/10/18 Yes Ravi, Himabindu, MD  famotidine (PEPCID) 20 MG tablet Take 20 mg by mouth daily.   Yes [provider]  metoprolol succinate (TOPROL-XL) 25 MG 24 hr tablet Take 1 tablet (25 mg total) by mouth 2 (two) times daily. 01/20/17  Yes Allegra Grana,  FNP  omeprazole (PRILOSEC) 20 MG capsule Take 20 mg by mouth daily.   Yes [provider]  ondansetron (ZOFRAN) 4 MG tablet Take 1 tablet (4 mg total) by mouth every 6 (six) hours as needed for nausea or vomiting. 02/08/17  Yes Midge Minium, MD  pantoprazole (PROTONIX) 20 MG tablet Take 1 tablet (20 mg total) by mouth daily. 01/20/17  Yes Arnett, Lyn Records, FNP  predniSONE (DELTASONE) 10 MG tablet Take 4 tablets ( total 40 mg)  by mouth for 2 days; take 3 tablets ( total 30 mg) by mouth for 2 days; take 2 tablets (total 20mg ) by mouth for 2 days; then take 1 tablet ( total 10mg ) by mouth for 2 days. 02/17/17  Yes Allegra Grana, FNP  traZODone (DESYREL) 100 MG tablet Take 1 tablet (100 mg total) by mouth at bedtime. 12/30/16  Yes Patrick North, MD    Allergies: Allergies  Allergen Reactions  . Penicillins Hives    Social History:  reports that she has never smoked. She has never used smokeless tobacco. She reports that she does not drink alcohol or use drugs.   Family History: Family History  Problem Relation Age of Onset  . Hyperlipidemia Mother   . Diabetes Mother   . Hyperlipidemia Father   . Heart disease Maternal Grandmother   . Diabetes Maternal Grandmother   . Heart disease Maternal Grandfather   . Diabetes Maternal Grandfather   . Heart disease Paternal Grandmother   . Diabetes Paternal Grandmother   . Alcohol abuse Paternal Grandfather   . Heart disease Paternal Grandfather   . Diabetes Paternal Grandfather     Review of Systems: Review of Systems  Constitutional: Negative for chills and fever.  HENT: Negative for hearing loss.   Eyes: Negative for blurred vision.  Respiratory: Negative for cough.   Cardiovascular: Negative for chest pain.  Gastrointestinal: Positive for abdominal pain and nausea. Negative for constipation, diarrhea and vomiting.  Genitourinary: Negative for dysuria.  Musculoskeletal: Negative for myalgias.  Skin: Negative for rash.  Neurological: Negative for dizziness.  Psychiatric/Behavioral: Negative for depression.  All other systems reviewed and are negative.   Physical Exam BP 113/70   Pulse 83   Temp 98.3 F (36.8 C) (Oral)   Ht 5\' 6"  (1.676 m)   Wt 70.6 kg (155 lb 9.6 oz)   BMI 25.11 kg/m  CONSTITUTIONAL: No acute distress HEENT:  Normocephalic, atraumatic, extraocular motion intact. NECK: Trachea is midline, and there is no jugular venous  distension.  RESPIRATORY:  Lungs are clear, and breath sounds are equal bilaterally. Normal respiratory effort without pathologic use of accessory muscles. CARDIOVASCULAR: Heart is regular without murmurs, gallops, or rubs. GI: The abdomen is soft, nondistended, with mild tenderness to palpation in the right upper quadrant. He is Murphy sign. There were no palpable masses. All incisions from her gastric bypass are well-healed. MUSCULOSKELETAL:  Normal muscle strength and tone in all four extremities.  No peripheral edema or cyanosis. SKIN: Skin turgor is normal. There are no pathologic skin lesions.  NEUROLOGIC:  Motor and sensation is grossly normal.  Cranial nerves are grossly intact. PSYCH:  Alert and oriented to person, place and time. Affect is normal.  Laboratory Analysis: Lab work done on 01/18/17 shows a normal white blood cell count of 8.2 with a total bilirubin of 0.3, AST 16, a LT 67, alkaline phosphatase 42, lipase 39.  Imaging: Ultrasound on 4/30 shows multiple gallstones with no evidence of acute cholecystitis and a common  bile duct measuring 7-8 mm. Follow-up MRCP on 5/19 shows again cholelithiasis but no choledocholithiasis.  Assessment and Plan: This is a 31 y.o. female who presents with symptomatic cholelithiasis. I have independently reviewed the patient's laboratory and imaging workup. There is no evidence of acute cholecystitis on her studies but she does have cholelithiasis and no choledocholithiasis as noted on her MRI.  Discussed with the patient that given the findings and a negative EGD, most likely her gallbladder is was the cause of her abdominal symptoms. Scars with her the surgical management for symptomatic cholelithiasis which is cholecystectomy. Scars with her that we would attempt laparoscopic cholecystectomy first year given that she's had this symptoms for approximate 2 months, her gallbladder may be too inflamed and discussed with her the possibility of needing to  convert to an open procedure in order to prevent any complications. Also discussed with the patient the risks of bleeding, infection, and injury to surrounding structures. All of her questions have been answered and she is willing to proceed with surgery. She will be scheduled for 6/14.  Face-to-face time spent with the patient and care providers was 45 minutes, with more than 50% of the time spent counseling, educating, and coordinating care of the patient.     Howie IllJose Luis Shaena Parkerson, MD [facility]

## 2017-02-18 NOTE — Patient Instructions (Signed)
We have your surgery scheduled for 03/04/17 At [facility] with Dr.Piscoya. Please see your blue pre-care sheet for surgery information. Please call our office if you have any questions or concerns.

## 2017-02-19 ENCOUNTER — Telehealth: Payer: Self-pay | Admitting: Surgery

## 2017-02-19 ENCOUNTER — Other Ambulatory Visit: Payer: Self-pay | Admitting: Family

## 2017-02-19 DIAGNOSIS — M545 Low back pain, unspecified: Secondary | ICD-10-CM

## 2017-02-19 MED ORDER — ONDANSETRON 4 MG PO TBDP: 4.0000 mg | ORAL_TABLET | Freq: Three times a day (TID) | ORAL | 0 refills | Status: DC | PRN

## 2017-02-19 MED ORDER — ONDANSETRON HCL 4 MG PO TABS
4.0000 mg | ORAL_TABLET | Freq: Four times a day (QID) | ORAL | 1 refills | Status: DC | PRN
Start: 2017-02-19 — End: 2017-03-04

## 2017-02-19 NOTE — Telephone Encounter (Signed)
Pt advised of pre op date/time and sx date. Sx: 03/04/17 with Dr Dellis FilbertPiscoya--Laparoscopic cholecystectomy.  Pre op: 02/24/17 between 9-1:00pm--phone.   Patient made aware to call [phone], between 1-3:00pm the day before surgery, to find out what time to arrive.

## 2017-02-19 NOTE — Addendum Note (Signed)
Addended by: Cameron ProudHILDERS, Melayah Skorupski S on: 02/19/2017 10:12 AM   Modules accepted: Orders

## 2017-02-19 NOTE — Telephone Encounter (Signed)
Medication [facility]

## 2017-02-22 ENCOUNTER — Emergency Department: Payer: 59

## 2017-02-22 ENCOUNTER — Encounter: Payer: Self-pay | Admitting: Emergency Medicine

## 2017-02-22 ENCOUNTER — Emergency Department
Admission: EM | Admit: 2017-02-22 | Discharge: 2017-02-22 | Disposition: A | Discharge status: Home / Self Care | Payer: Commercial Managed Care - HMO | Attending: Emergency Medicine | Admitting: Emergency Medicine

## 2017-02-22 ENCOUNTER — Telehealth: Payer: Self-pay

## 2017-02-22 DIAGNOSIS — K8020 Calculus of gallbladder without cholecystitis without obstruction: Secondary | ICD-10-CM | POA: Insufficient documentation

## 2017-02-22 DIAGNOSIS — K8050 Calculus of bile duct without cholangitis or cholecystitis without obstruction: Secondary | ICD-10-CM

## 2017-02-22 LAB — COMPREHENSIVE METABOLIC PANEL
ALT: 14 U/L (ref 14–54)
AST: 12 U/L — ABNORMAL LOW (ref 15–41)
Albumin: 4 g/dL (ref 3.5–5.0)
Alkaline Phosphatase: 45 U/L (ref 38–126)
Anion gap: 5 (ref 5–15)
BUN: 14 mg/dL (ref 6–20)
CHLORIDE: 108 mmol/L (ref 101–111)
CO2: 29 mmol/L (ref 22–32)
CREATININE: 0.69 mg/dL (ref 0.44–1.00)
Calcium: 9.1 mg/dL (ref 8.9–10.3)
Glucose, Bld: 84 mg/dL (ref 65–99)
Potassium: 3.9 mmol/L (ref 3.5–5.1)
SODIUM: 142 mmol/L (ref 135–145)
Total Bilirubin: 0.6 mg/dL (ref 0.3–1.2)
Total Protein: 6.6 g/dL (ref 6.5–8.1)

## 2017-02-22 LAB — LIPASE, BLOOD: LIPASE: 18 U/L (ref 11–51)

## 2017-02-22 LAB — CBC
HCT: 32.1 % — ABNORMAL LOW (ref 35.0–47.0)
Hemoglobin: 10.8 g/dL — ABNORMAL LOW (ref 12.0–16.0)
MCH: 29.6 pg (ref 26.0–34.0)
MCHC: 33.8 g/dL (ref 32.0–36.0)
MCV: 87.8 fL (ref 80.0–100.0)
PLATELETS: 226 10*3/uL (ref 150–440)
RBC: 3.66 MIL/uL — AB (ref 3.80–5.20)
RDW: 14.8 % — AB (ref 11.5–14.5)
WBC: 5.3 10*3/uL (ref 3.6–11.0)

## 2017-02-22 MED ORDER — KETOROLAC TROMETHAMINE 30 MG/ML IJ SOLN
30.0000 mg | Freq: Once | INTRAMUSCULAR | Status: AC
  Administered 2017-02-22: 30 mg via INTRAVENOUS
  Filled 2017-02-22: qty 1

## 2017-02-22 MED ORDER — MORPHINE SULFATE (PF) 4 MG/ML IV SOLN
4.0000 mg | Freq: Once | INTRAVENOUS | Status: DC
  Filled 2017-02-22: qty 1

## 2017-02-22 MED ORDER — TRAMADOL HCL 50 MG PO TABS: 50.0000 mg | ORAL_TABLET | Freq: Four times a day (QID) | ORAL | 0 refills | Status: DC | PRN

## 2017-02-22 MED ORDER — ONDANSETRON HCL 4 MG/2ML IJ SOLN
4.0000 mg | Freq: Once | INTRAMUSCULAR | Status: DC
  Filled 2017-02-22: qty 2

## 2017-02-22 NOTE — ED Notes (Signed)
Patient transported to US at this time.  

## 2017-02-22 NOTE — Telephone Encounter (Signed)
Patient called in this morning stating that the last time she saw Dr. Aleen CampiPiscoya, he advised her to go to the Emergency Room if she is feeling any pain prior to her scheduled surgery. Patient states she is feeling pain. I advised her to go to the emergency room, since that is what Dr. Aleen CampiPiscoya advised her to do. Patient understood.

## 2017-02-22 NOTE — ED Provider Notes (Signed)
[facility] Emergency Department Provider Note  Time seen: 9:28 AM  I have reviewed the triage vital signs and the nursing notes.   HISTORY  Chief Complaint Abdominal Pain    HPI Allexus Ovens is a 31 y.o. female with known biliary disease presents the emergency department for right upper quadrant abdominal pain. According to the patient she has known gallstones, and is scheduled to have her gallbladder out 03/04/17. She states however she awoke this morning with severe right upper quadrant abdominal pain nausea and dry heaving. Denies any vomit or fever. States she has been having diarrhea over the past one week. Patient called the surgeon's office and they recommended she come to the emergency department for evaluation. Patient describes her pain as moderate aching pain 7/10 currently.  Past Medical History:  Diagnosis Date  . Anxiety   . Arthritis    lower back, knees  . Bipolar affective (HCC) 08/12/2016  . Bipolar affective disorder (HCC)   . Cardiac arrhythmia due to congenital heart disease   . Degenerative disc disease, lumbar   . Family history of adverse reaction to anesthesia    Mopther - "aspirates every time"  . GERD (gastroesophageal reflux disease)   . Intractable vomiting with nausea   . Palpitations 08/24/2016  . Poor concentration 01/14/2017  . Rash and nonspecific skin eruption 10/01/2016  . Sleep apnea 10/10/2013  . SVT (supraventricular tachycardia) (HCC) 2006   had been on metoprolol 10mg  but stopped taking    Patient Active Problem List   Diagnosis Date Noted  . Symptomatic cholelithiasis 02/18/2017  . Intractable vomiting with nausea   . Bariatric surgery status   . GERD (gastroesophageal reflux disease) 01/20/2017  . Poor concentration 01/14/2017  . Rash and nonspecific skin eruption 10/01/2016  . Midline low back pain without sciatica 10/01/2016  . Palpitations 08/24/2016  . Bipolar affective (HCC) 08/12/2016  . Encounter  to establish care 08/12/2016  . Sleep apnea 10/10/2013    Past Surgical History:  Procedure Laterality Date  . CESAREAN SECTION     x2  . ESOPHAGOGASTRODUODENOSCOPY (EGD) WITH PROPOFOL N/A 01/28/2017   Procedure: ESOPHAGOGASTRODUODENOSCOPY (EGD) WITH PROPOFOL;  Surgeon: Midge Minium, MD;  Location: [facility];  Service: Endoscopy;  Laterality: N/A;  . GASTRIC BYPASS      Prior to Admission medications   Medication Sig Start Date End Date Taking? Authorizing Provider  aspirin 81 MG chewable tablet Chew by mouth daily.    [provider]  Biotin 1000 MCG tablet Take 1,000 mcg by mouth 3 (three) times daily.    [provider]  cholecalciferol (VITAMIN D) 1000 units tablet Take 2,000 Units by mouth daily.    [provider]  cyanocobalamin 2000 MCG tablet Take 2,500 mcg by mouth daily.    [provider]  cyclobenzaprine (FLEXERIL) 10 MG tablet Take 1 tablet (10 mg total) by mouth at bedtime as needed for muscle spasms. 01/18/17   Allegra Grana, FNP  DULoxetine (CYMBALTA) 30 MG capsule Take 1 capsule (30 mg total) by mouth daily. 02/10/17 02/10/18  Patrick North, MD  famotidine (PEPCID) 20 MG tablet Take 20 mg by mouth daily.    [provider]  metoprolol succinate (TOPROL-XL) 25 MG 24 hr tablet Take 1 tablet (25 mg total) by mouth 2 (two) times daily. 01/20/17   Allegra Grana, FNP  omeprazole (PRILOSEC) 20 MG capsule Take 20 mg by mouth daily.    [provider]  ondansetron (ZOFRAN ODT)  4 MG disintegrating tablet Take 1 tablet (4 mg total) by mouth every 8 (eight) hours as needed for nausea or vomiting. 02/19/17   Piscoya, Elita QuickJose, MD  ondansetron (ZOFRAN) 4 MG tablet Take 1 tablet (4 mg total) by mouth every 6 (six) hours as needed for nausea or vomiting. 02/19/17   Allegra GranaArnett, Margaret G, FNP  pantoprazole (PROTONIX) 20 MG tablet Take 1 tablet (20 mg total) by mouth daily. 01/20/17   Allegra GranaArnett, Margaret G, FNP  predniSONE (DELTASONE)  10 MG tablet Take 4 tablets ( total 40 mg) by mouth for 2 days; take 3 tablets ( total 30 mg) by mouth for 2 days; take 2 tablets (total 20mg ) by mouth for 2 days; then take 1 tablet ( total 10mg ) by mouth for 2 days. 02/17/17   Allegra GranaArnett, Margaret G, FNP  traZODone (DESYREL) 100 MG tablet Take 1 tablet (100 mg total) by mouth at bedtime. 12/30/16   Patrick Northavi, Himabindu, MD    Allergies  Allergen Reactions  . Penicillins Hives    Family History  Problem Relation Age of Onset  . Hyperlipidemia Mother   . Diabetes Mother   . Hyperlipidemia Father   . Heart disease Maternal Grandmother   . Diabetes Maternal Grandmother   . Heart disease Maternal Grandfather   . Diabetes Maternal Grandfather   . Heart disease Paternal Grandmother   . Diabetes Paternal Grandmother   . Alcohol abuse Paternal Grandfather   . Heart disease Paternal Grandfather   . Diabetes Paternal Grandfather     Social History Social History  Substance Use Topics  . Smoking status: Never Smoker  . Smokeless tobacco: Never Used  . Alcohol use No    Review of Systems Constitutional: Negative for fever Cardiovascular: Negative for chest pain. Respiratory: Negative for shortness of breath. Gastrointestinal: Right upper quadrant abdominal pain. Positive for nausea. Negative for black or bloody stool. Genitourinary: Negative for dysuria. Negative for hematuria. Negative for vaginal bleeding and discharge. Musculoskeletal: Negative for back pain. Skin: Negative for rash. Neurological: Negative for headache All other ROS negative  ____________________________________________   PHYSICAL EXAM:  VITAL SIGNS: ED Triage Vitals  Enc Vitals Group     BP 02/22/17 0923 126/73     Pulse Rate 02/22/17 0923 82     Resp 02/22/17 0923 18     Temp 02/22/17 0923 98.5 F (36.9 C)     Temp Source 02/22/17 0923 Oral     SpO2 02/22/17 0923 100 %     Weight --      Height --      Head Circumference --      Peak Flow --      Pain  Score 02/22/17 0924 8     Pain Loc --      Pain Edu? --      Excl. in GC? --     Constitutional: Alert and oriented. Well appearing and in no distress. Eyes: Normal exam ENT   Head: Normocephalic and atraumatic.   Mouth/Throat: Mucous membranes are moist. Cardiovascular: Normal rate, regular rhythm. No murmur Respiratory: Normal respiratory effort without tachypnea nor retractions. Breath sounds are clear Gastrointestinal: soft moderate right upper quadrant tenderness, no rebound or guarding Musculoskeletal: Nontender with normal range of motion in all extremities.  Neurologic:  Normal speech and language. No gross focal neurologic deficits Skin:  Skin is warm, dry and intact.  Psychiatric: Mood and affect are normal.  ____________________________________________     RADIOLOGY  IMPRESSION: 1. Extensive cholelithiasis with sonographic Murphy's  sign, compatible with acute calculous cholecystitis in the correct clinical setting. 2. No biliary ductal dilatation. 3. Normal liver.  ____________________________________________   INITIAL IMPRESSION / ASSESSMENT AND PLAN / ED COURSE  Pertinent labs & imaging results that were available during my care of the patient were reviewed by me and considered in my medical decision making (see chart for details).  Patient with known biliary disease presents to the emergency department with right upper quadrant pain constant moderate since this morning. We will check labs, right upper quadrant ultrasound to further evaluate and likely discussed with general surgery.  Patient's ultrasound shows cholelithiasis. Patient's labs are negative including LFTs and white blood cell count. I discussed the patient with Dr. Earlene Plater who recommends pain control and discharged home with follow-up with general surgery as scheduled for her cholecystectomy on 6/14. Patient agreeable to plan.  ____________________________________________   FINAL CLINICAL  IMPRESSION(S) / ED DIAGNOSES  Right upper quadrant abdominal pain    Minna Antis, MD 02/22/17 1110

## 2017-02-22 NOTE — ED Triage Notes (Signed)
Supposed to have gallbladder out this month. started yesterday with pain flare. No vomiting has had nausea.  NAD. Ambulatory to triage.

## 2017-02-23 ENCOUNTER — Telehealth: Payer: Self-pay

## 2017-02-23 ENCOUNTER — Telehealth: Payer: Self-pay | Admitting: Family

## 2017-02-23 NOTE — Telephone Encounter (Signed)
Placed call to check status of Clearances from Dr.Margaret Arnetts office, however was told they had not received them. Confirmation was received on 02/18/17 @ 17:02 was received from previous send.  Both clearances were faxed at this time with confirmation.

## 2017-02-23 NOTE — Telephone Encounter (Signed)
Call sheila at [place] [facility] and advise to fax to patient's cardiologist  Pt has h/o palpitations and saw dr Alvino Chapelingal this year.

## 2017-02-23 NOTE — Telephone Encounter (Signed)
Tried to call sheila and was informed she has left for the day. I was instructed to call back tomorrow.

## 2017-02-24 ENCOUNTER — Telehealth: Payer: Self-pay

## 2017-02-24 ENCOUNTER — Encounter
Admission: RE | Admit: 2017-02-24 | Discharge: 2017-02-24 | Disposition: A | Discharge status: Home / Self Care | Payer: Commercial Managed Care - HMO | Source: Ambulatory Visit | Attending: Surgery | Admitting: Surgery

## 2017-02-24 NOTE — Telephone Encounter (Signed)
Medical Clearance obtained from Dr.Margaret Arnett and scanned under Media.  Still awaiting Cardiac Clearance from Dr.Ingal.

## 2017-02-24 NOTE — Patient Instructions (Signed)
  Your procedure is scheduled on: 03-04-17 Report to Same Day Surgery 2nd floor medical mall [facility](Medical Mall Entrance-take elevator on left to 2nd floor.  Check in with surgery information desk.) To find out your arrival time please call [phone] between 1PM - 3PM on 03-03-17  Remember: Instructions that are not followed completely may result in serious medical risk, up to and including death, or upon the discretion of your surgeon and anesthesiologist your surgery may need to be rescheduled.    _x___ 1. Do not eat food or drink liquids after midnight. No gum chewing or                              hard candies.     __x__ 2. No Alcohol for 24 hours before or after surgery.   __x__3. No Smoking for 24 prior to surgery.   ____  4. Bring all medications with you on the day of surgery if instructed.    __x__ 5. Notify your doctor if there is any change in your medical condition     (cold, fever, infections).     Do not wear jewelry, make-up, hairpins, clips or nail polish.  Do not wear lotions, powders, or perfumes. You may wear deodorant.  Do not shave 48 hours prior to surgery. Men may shave face and neck.  Do not bring valuables to the hospital.    [facility] not [facility] for any belongings or valuables.               Contacts, dentures or bridgework may not be worn into surgery.  Leave your suitcase in the car. After surgery it may be brought to your room.  For patients admitted to the hospital, discharge time is determined by your                       treatment team.   Patients discharged the day of surgery will not be allowed to drive home.  You will need someone to drive you home and stay with you the night of your procedure.    Please read over the following fact sheets that you were given:     _x___ Take anti-hypertensive (unless it includes a diuretic), cardiac, seizure, asthma,     anti-reflux and psychiatric medicines. These include:  1. CYMBALTA  2.  METOPROLOL  3. PROTONIX  4. TAKE AN EXTRA PROTONIX Wednesday NIGHT BEFORE BED  5.  6.  ____Fleets enema or Magnesium Citrate as directed.   ____ Use CHG Soap or sage wipes as directed on instruction sheet   ____ Use inhalers on the day of surgery and bring to hospital day of surgery  ____ Stop Metformin and Janumet 2 days prior to surgery.    ____ Take 1/2 of usual insulin dose the night before surgery and none on the morning     surgery.   _x___ Follow recommendations from Cardiologist, Pulmonologist or PCP regarding          stopping Aspirin, Coumadin, Pllavix ,Eliquis, Effient, or Pradaxa, and Pletal-ASK DR PISCOYA OR CARDIOLOGIST ABOUT STOPPING ASPIRIN  X____Stop Anti-inflammatories such as Advil, Aleve, Ibuprofen, Motrin, Naproxen, Naprosyn, Goodies powders or aspirin products NOW-OK to take Tylenol OR TRAMADOL   _X___ Stop supplements until after surgery-STOP BIOTIN NOW   ____ Bring C-Pap to the hospital.

## 2017-02-24 NOTE — Telephone Encounter (Signed)
Paperwork has been faxed back stating that Madeline White is requiring clearance from cardiology.  From a general medicine standpoint she felt she is cleared.  PCP would like cardiology to give clearance.

## 2017-02-24 NOTE — Pre-Procedure Instructions (Signed)
[name]  ECHO COMPLETE WO IMAGE ENHANCING AGENT  Order# [id number]  Reading physician: Iran Ouch, MD Ordering physician: Almond Lint, MD Study date: 11/24/16  Result Notes   Notes Recorded by Bryna Colander, RN on 11/27/2016 at 1:09 PM EST Reviewed results with patient and she verbalized understanding with no further questions. ------  Notes Recorded by Almond Lint, MD on 11/26/2016 at 6:22 PM EST Overall okay      Study Result   Result status: Final result                           *[facility]*                  [address]                        [place], [place] [address]                            [phone]  ------------------------------------------------------------------- Transthoracic Echocardiography  Patient:    Madeline, White MR #:       [id number] Study Date: 11/24/2016 Gender:     F Age:        31 Height:     167.6 cm Weight:     69.1 kg BSA:        1.8 m^2 Pt. Status: Room:   ATTENDING    Default, Provider [phone]  PERFORMING   Dexter, [place]  SONOGRAPHER  Quentin Ore, RVT, RDCS, RDMS  ORDERING     Almond Lint, MD  REFERRING    Almond Lint, MD  cc:  ------------------------------------------------------------------- LV EF: 50% -   55%  ------------------------------------------------------------------- History:   PMH:  H/O SVT.  ------------------------------------------------------------------- Study Conclusions  - Left ventricle: The cavity size was normal. Wall thickness was   normal. Systolic function was at the lower limits of normal. The   estimated ejection fraction was in the range of 50% to 55%. Wall   motion was normal; there were no regional wall motion   abnormalities. Left ventricular diastolic function parameters   were normal. - Pulmonary arteries: Systolic pressure was within the normal   range.  ------------------------------------------------------------------- Labs, prior  tests, procedures, and surgery: ECG.     Abnormal.  ------------------------------------------------------------------- Study data:  No prior study was available for comparison.  Study status:  Routine.  Procedure:  Transthoracic echocardiography. Image quality was good. The study was technically difficult, as a result of poor acoustic windows.  Study completion:  There were no complications.          Transthoracic echocardiography.  M-mode, complete 2D, spectral Doppler, and color Doppler.  Birthdate: Patient birthdate: 31-Oct-1985.  Age:  Patient is 31 yr old.  Sex: Gender: female.    BMI: 24.6 kg/m^2.  Blood pressure:     118/70 Patient status:  Outpatient.  Study date:  Study date: 11/24/2016. Study time: 11:53 AM.  -------------------------------------------------------------------  ------------------------------------------------------------------- Left ventricle:  The cavity size was normal. Wall thickness was normal. Systolic function was at the lower limits of normal. The estimated ejection fraction was in the range of 50% to 55%. Wall motion was normal; there were no regional wall motion abnormalities. The transmitral flow pattern was normal. The deceleration time of the early transmitral flow velocity was normal. The pulmonary vein flow pattern was normal. The tissue Doppler parameters were normal.  Left ventricular diastolic function parameters were normal.  ------------------------------------------------------------------- Aortic valve:   Trileaflet; normal thickness leaflets. Mobility was not restricted.  Doppler:  Transvalvular velocity was within the normal range. There was no stenosis. There was no regurgitation. Peak gradient (S): 6 mm Hg.  ------------------------------------------------------------------- Aorta:  Aortic root: The aortic root was normal in size.  ------------------------------------------------------------------- Mitral valve:   Mildly  thickened leaflets . Mobility was not restricted.  Doppler:  Transvalvular velocity was within the normal range. There was no evidence for stenosis. There was trivial regurgitation.    Valve area by pressure half-time: 3.61 cm^2. Indexed valve area by pressure half-time: 2 cm^2/m^2.    Peak gradient (D): 2 mm Hg.  ------------------------------------------------------------------- Left atrium:  The atrium was normal in size.  ------------------------------------------------------------------- Right ventricle:  The cavity size was normal. Wall thickness was normal. Systolic function was normal.  ------------------------------------------------------------------- Pulmonic valve:    Doppler:  Transvalvular velocity was within the normal range. There was no evidence for stenosis.  ------------------------------------------------------------------- Tricuspid valve:   Structurally normal valve.    Doppler: Transvalvular velocity was within the normal range. There was trivial regurgitation.  ------------------------------------------------------------------- Pulmonary artery:   The main pulmonary artery was normal-sized. Systolic pressure was within the normal range.  ------------------------------------------------------------------- Right atrium:  The atrium was normal in size.  ------------------------------------------------------------------- Pericardium:  There was no pericardial effusion.  ------------------------------------------------------------------- Systemic veins: Inferior vena cava: The vessel was normal in size.  ------------------------------------------------------------------- Measurements   Left ventricle                           Value          Reference  LV ID, ED, PLAX chordal                  46    mm       43 - 52  LV ID, ES, PLAX chordal                  33    mm       23 - 38  LV fx shortening, PLAX chordal   (L)     28    %        >=29  LV  PW thickness, ED                      8     mm       ----------  IVS/LV PW ratio, ED                      0.88           <=1.3  Stroke volume, 2D                        46    ml       ----------  Stroke volume/bsa, 2D                    26    ml/m^2   ----------  LV ejection fraction, 1-p A4C            48    %        ----------  LV end-diastolic volume, 2-p             104   ml       ----------  LV end-systolic  volume, 2-p              57    ml       ----------  LV ejection fraction, 2-p                45    %        ----------  Stroke volume, 2-p                       47    ml       ----------  LV end-diastolic volume/bsa, 2-p         58    ml/m^2   ----------  LV end-systolic volume/bsa, 2-p          32    ml/m^2   ----------  Stroke volume/bsa, 2-p                   26.1  ml/m^2   ----------  LV e&', lateral                           16.5  cm/s     ----------  LV E/e&', lateral                         4.76           ----------  LV e&', medial                            12    cm/s     ----------  LV E/e&', medial                          6.55           ----------  LV e&', average                           14.25 cm/s     ----------  LV E/e&', average                         5.52           ----------    Ventricular septum                       Value          Reference  IVS thickness, ED                        7     mm       ----------    LVOT                                     Value          Reference  LVOT ID, S                               19    mm       ----------  LVOT area  2.84  cm^2     ----------  LVOT ID                                  19    mm       ----------  LVOT mean velocity, S                    57.2  cm/s     ----------  LVOT VTI, S                              16.2  cm       ----------  Stroke volume (SV), LVOT DP              45.9  ml       ----------  Stroke index (SV/bsa), LVOT DP           25.5  ml/m^2   ----------    Aortic valve                              Value          Reference  Aortic valve peak velocity, S            119   cm/s     ----------  Aortic peak gradient, S                  6     mm Hg    ----------    Aorta                                    Value          Reference  Aortic root ID, ED                       29    mm       ----------    Left atrium                              Value          Reference  LA ID, A-P, ES                           30    mm       ----------  LA ID/bsa, A-P                           1.66  cm/m^2   <=2.2  LA volume, S                             37.6  ml       ----------  LA volume/bsa, S                         20.9  ml/m^2   ----------  LA volume, ES, 1-p A4C                   34.4  ml       ----------  LA volume/bsa, ES, 1-p A4C               19.1  ml/m^2   ----------  LA volume, ES, 1-p A2C                   37.3  ml       ----------  LA volume/bsa, ES, 1-p A2C               20.7  ml/m^2   ----------    Mitral valve                             Value          Reference  Mitral E-wave peak velocity              78.6  cm/s     ----------  Mitral A-wave peak velocity              56.8  cm/s     ----------  Mitral deceleration time                 208   ms       150 - 230  Mitral pressure half-time                61    ms       ----------  Mitral peak gradient, D                  2     mm Hg    ----------  Mitral E/A ratio, peak                   1.4            ----------  Mitral valve area, PHT, DP               3.61  cm^2     ----------  Mitral valve area/bsa, PHT, DP           2     cm^2/m^2 ----------    Right atrium                             Value          Reference  RA ID, S-I, ES, A4C                      41    mm       34 - 49  RA area, ES, A4C                         9.89  cm^2     8.3 - 19.5  RA volume, ES, A/L                       18.4  ml       ----------  RA volume/bsa, ES, A/L                   10.2  ml/m^2   ----------    Right ventricle                           Value  Reference  TAPSE                                    16.9  mm       ----------  RV s&', lateral, S                        10.8  cm/s     ----------    Pulmonic valve                           Value          Reference  Pulmonic valve peak velocity, S          78.6  cm/s     ----------  Legend: (L)  and  (H)  mark values outside specified reference range.  ------------------------------------------------------------------- Prepared and Electronically Authenticated by  Lorine Bears, MD 2018-03-08T07:49:39  PACS Images   Show images for ECHOCARDIOGRAM COMPLETE  Patient Information   Patient Name Sahiti, Joswick Sex Female DOB 04/04/1986 SSN [id number]  Reason For Exam  Priority: Routine  Dx: Palpitations [R00.2 (ICD-10-CM)]  Surgical History   Surgical History   No past medical history on file.    Other Surgical History   Procedure Laterality Date Comment Source  CESAREAN SECTION   x2 Provider  ESOPHAGOGASTRODUODENOSCOPY (EGD) WITH PROPOFOL N/A 01/28/2017 Procedure: ESOPHAGOGASTRODUODENOSCOPY (EGD) WITH PROPOFOL; Surgeon: Midge Minium, MD; Location: [facility]; Service: Endoscopy; Laterality: N/A; Provider  GASTRIC BYPASS    Provider    Performing Technologist/Nurse   Performing Technologist/Nurse: Quentin Ore T                    Implants     No active implants to display in this view.  Order-Level Documents:   There are no order-level documents.  Encounter-Level Documents - 11/24/2016:   Electronic signature on 11/24/2016 12:44 PM      Signed   Electronically signed by Iran Ouch, MD on 11/26/16 at [phone] EST  Printable Result Report   Result Report   External Result Report   External Result Report    Patient Result Comments   Viewed by Luanna Salk on 11/27/2016 2:32 PM  Written by Bryna Colander, RN on 11/27/2016 1:09 PM  It was a pleasure speaking with you today. We will see you next week  at your appointment.   Thanks,  Qulin Cellar RN, BSN  [phone]

## 2017-02-24 NOTE — Telephone Encounter (Signed)
Spoke with Mal AmabileBrock from [facility]aret Arnetts office. He stated Dr.Arnett would prefer for Cardiologist to sign off on the clearance.  Cardiac Clearance faxed to Dr.Aileen Ingal at this time.

## 2017-02-25 ENCOUNTER — Telehealth: Payer: Self-pay | Admitting: Nurse Practitioner

## 2017-02-25 NOTE — Telephone Encounter (Signed)
Previously normal echo and unrevealing holter.  May proceed to surgery w/o further cardiac eval.  Cont  blocker throughout perioperative period given h/o palpitations.  Nicolasa Duckinghristopher Chandon Lazcano, NP 02/25/2017, 11:20 AM

## 2017-02-25 NOTE — Telephone Encounter (Signed)
Clearance routed to number provided.  

## 2017-02-25 NOTE — Telephone Encounter (Signed)
Cardiac clearance [facility] received for [facility]. Patient is scheduled for cholecystectomy on 03/04/17 with Dr. Aleen CampiPiscoya. Fax clearance to [phone]. Form placed in "To Do" bin in red folder on Pamela's desk.

## 2017-03-01 ENCOUNTER — Encounter: Payer: Self-pay | Admitting: Surgery

## 2017-03-01 ENCOUNTER — Other Ambulatory Visit: Payer: Self-pay | Admitting: Family

## 2017-03-01 ENCOUNTER — Telehealth: Payer: Self-pay

## 2017-03-01 MED ORDER — TRAMADOL HCL 50 MG PO TABS: 50.0000 mg | ORAL_TABLET | Freq: Four times a day (QID) | ORAL | 0 refills | Status: DC | PRN

## 2017-03-01 NOTE — Telephone Encounter (Signed)
Spoke with Dr. Aleen CampiPiscoya at this time. He has refilled Ultram 50mg  - 1 tablet q6h PRN- #12 with 0 refills.  Call made to patient. She was given this information and that prescription is ready at front desk for her to pick up.

## 2017-03-02 ENCOUNTER — Telehealth: Payer: Self-pay

## 2017-03-02 NOTE — Telephone Encounter (Signed)
Cardiac Clearance obtained at this time from Nicolasa Duckinghristopher Berge NP.

## 2017-03-03 ENCOUNTER — Other Ambulatory Visit: Payer: Self-pay | Admitting: Surgery

## 2017-03-04 ENCOUNTER — Ambulatory Visit
Admission: RE | Admit: 2017-03-04 | Discharge: 2017-03-04 | Disposition: A | Discharge status: Home / Self Care | Payer: 59 | Source: Ambulatory Visit | Attending: Surgery | Admitting: Surgery

## 2017-03-04 ENCOUNTER — Ambulatory Visit: Payer: 59 | Admitting: Certified Registered Nurse Anesthetist

## 2017-03-04 ENCOUNTER — Encounter: Payer: Self-pay | Admitting: *Deleted

## 2017-03-04 ENCOUNTER — Encounter
Admission: RE | Disposition: A | Discharge status: Home / Self Care | Payer: Self-pay | Source: Ambulatory Visit | Attending: Surgery

## 2017-03-04 DIAGNOSIS — G4730 Sleep apnea, unspecified: Secondary | ICD-10-CM | POA: Insufficient documentation

## 2017-03-04 DIAGNOSIS — K219 Gastro-esophageal reflux disease without esophagitis: Secondary | ICD-10-CM | POA: Insufficient documentation

## 2017-03-04 DIAGNOSIS — K8010 Calculus of gallbladder with chronic cholecystitis without obstruction: Secondary | ICD-10-CM | POA: Insufficient documentation

## 2017-03-04 DIAGNOSIS — Z9884 Bariatric surgery status: Secondary | ICD-10-CM | POA: Insufficient documentation

## 2017-03-04 DIAGNOSIS — Z7982 Long term (current) use of aspirin: Secondary | ICD-10-CM | POA: Insufficient documentation

## 2017-03-04 DIAGNOSIS — K8020 Calculus of gallbladder without cholecystitis without obstruction: Secondary | ICD-10-CM

## 2017-03-04 DIAGNOSIS — M1990 Unspecified osteoarthritis, unspecified site: Secondary | ICD-10-CM | POA: Insufficient documentation

## 2017-03-04 DIAGNOSIS — F319 Bipolar disorder, unspecified: Secondary | ICD-10-CM | POA: Insufficient documentation

## 2017-03-04 DIAGNOSIS — Z79899 Other long term (current) drug therapy: Secondary | ICD-10-CM | POA: Insufficient documentation

## 2017-03-04 DIAGNOSIS — Z880 Allergy status to penicillin: Secondary | ICD-10-CM | POA: Insufficient documentation

## 2017-03-04 DIAGNOSIS — F419 Anxiety disorder, unspecified: Secondary | ICD-10-CM | POA: Insufficient documentation

## 2017-03-04 DIAGNOSIS — I471 Supraventricular tachycardia: Secondary | ICD-10-CM | POA: Insufficient documentation

## 2017-03-04 HISTORY — PX: CHOLECYSTECTOMY: SHX55

## 2017-03-04 LAB — POCT PREGNANCY, URINE: PREG TEST UR: NEGATIVE

## 2017-03-04 SURGERY — LAPAROSCOPIC CHOLECYSTECTOMY
Anesthesia: General | Site: Abdomen | Wound class: Clean Contaminated

## 2017-03-04 MED ORDER — BUPIVACAINE-EPINEPHRINE (PF) 0.25% -1:200000 IJ SOLN
INTRAMUSCULAR | Status: DC | PRN
  Administered 2017-03-04: 20 mL

## 2017-03-04 MED ORDER — OXYCODONE-ACETAMINOPHEN 5-325 MG PO TABS: 1.0000 | ORAL_TABLET | Freq: Four times a day (QID) | ORAL | Status: DC | PRN

## 2017-03-04 MED ORDER — EPHEDRINE SULFATE 50 MG/ML IJ SOLN
INTRAMUSCULAR | Status: DC | PRN
  Administered 2017-03-04 (×2): 5 mg via INTRAVENOUS

## 2017-03-04 MED ORDER — CLINDAMYCIN PHOSPHATE 900 MG/50ML IV SOLN
INTRAVENOUS | Status: AC
  Filled 2017-03-04: qty 50

## 2017-03-04 MED ORDER — CHLORHEXIDINE GLUCONATE CLOTH 2 % EX PADS
6.0000 | MEDICATED_PAD | Freq: Once | CUTANEOUS | Status: AC
  Administered 2017-03-04: 6 via TOPICAL

## 2017-03-04 MED ORDER — KETOROLAC TROMETHAMINE 30 MG/ML IJ SOLN
INTRAMUSCULAR | Status: AC
  Filled 2017-03-04: qty 1

## 2017-03-04 MED ORDER — LIDOCAINE HCL (CARDIAC) 20 MG/ML IV SOLN
INTRAVENOUS | Status: DC | PRN
  Administered 2017-03-04: 60 mg via INTRAVENOUS

## 2017-03-04 MED ORDER — CLINDAMYCIN PHOSPHATE 900 MG/50ML IV SOLN
INTRAVENOUS | Status: DC | PRN
  Administered 2017-03-04: 900 mg via INTRAVENOUS

## 2017-03-04 MED ORDER — FENTANYL CITRATE (PF) 250 MCG/5ML IJ SOLN
INTRAMUSCULAR | Status: AC
  Filled 2017-03-04: qty 5

## 2017-03-04 MED ORDER — FENTANYL CITRATE (PF) 100 MCG/2ML IJ SOLN
INTRAMUSCULAR | Status: DC | PRN
  Administered 2017-03-04 (×2): 50 ug via INTRAVENOUS
  Administered 2017-03-04: 100 ug via INTRAVENOUS
  Administered 2017-03-04: 50 ug via INTRAVENOUS

## 2017-03-04 MED ORDER — ACETAMINOPHEN 500 MG PO TABS
ORAL_TABLET | ORAL | Status: AC
  Filled 2017-03-04: qty 2

## 2017-03-04 MED ORDER — MIDAZOLAM HCL 2 MG/2ML IJ SOLN
INTRAMUSCULAR | Status: AC
  Filled 2017-03-04: qty 2

## 2017-03-04 MED ORDER — OXYCODONE HCL 5 MG PO TABS
5.0000 mg | ORAL_TABLET | Freq: Four times a day (QID) | ORAL | Status: DC | PRN
  Administered 2017-03-04: 5 mg via ORAL

## 2017-03-04 MED ORDER — OXYCODONE HCL 5 MG PO TABS
ORAL_TABLET | ORAL | Status: AC
  Administered 2017-03-04: 5 mg
  Filled 2017-03-04: qty 1

## 2017-03-04 MED ORDER — GABAPENTIN 300 MG PO CAPS
ORAL_CAPSULE | ORAL | Status: AC
  Filled 2017-03-04: qty 1

## 2017-03-04 MED ORDER — GABAPENTIN 300 MG PO CAPS
300.0000 mg | ORAL_CAPSULE | ORAL | Status: AC
  Administered 2017-03-04: 300 mg via ORAL

## 2017-03-04 MED ORDER — ONDANSETRON HCL 4 MG/2ML IJ SOLN
INTRAMUSCULAR | Status: AC
  Filled 2017-03-04: qty 2

## 2017-03-04 MED ORDER — DEXAMETHASONE SODIUM PHOSPHATE 10 MG/ML IJ SOLN
INTRAMUSCULAR | Status: AC
  Filled 2017-03-04: qty 1

## 2017-03-04 MED ORDER — PROPOFOL 10 MG/ML IV BOLUS
INTRAVENOUS | Status: DC | PRN
  Administered 2017-03-04: 160 mg via INTRAVENOUS

## 2017-03-04 MED ORDER — ACETAMINOPHEN 10 MG/ML IV SOLN
INTRAVENOUS | Status: AC
  Filled 2017-03-04: qty 100

## 2017-03-04 MED ORDER — BUPIVACAINE-EPINEPHRINE (PF) 0.25% -1:200000 IJ SOLN
INTRAMUSCULAR | Status: AC
  Filled 2017-03-04: qty 30

## 2017-03-04 MED ORDER — ONDANSETRON HCL 4 MG/2ML IJ SOLN
4.0000 mg | Freq: Once | INTRAMUSCULAR | Status: AC | PRN
  Administered 2017-03-04: 4 mg via INTRAVENOUS

## 2017-03-04 MED ORDER — MIDAZOLAM HCL 2 MG/2ML IJ SOLN
INTRAMUSCULAR | Status: DC | PRN
  Administered 2017-03-04: 2 mg via INTRAVENOUS

## 2017-03-04 MED ORDER — EPHEDRINE SULFATE 50 MG/ML IJ SOLN
INTRAMUSCULAR | Status: AC
  Filled 2017-03-04: qty 1

## 2017-03-04 MED ORDER — PROPOFOL 10 MG/ML IV BOLUS
INTRAVENOUS | Status: AC
  Filled 2017-03-04: qty 20

## 2017-03-04 MED ORDER — CHLORHEXIDINE GLUCONATE CLOTH 2 % EX PADS: 6.0000 | MEDICATED_PAD | Freq: Once | CUTANEOUS | Status: DC

## 2017-03-04 MED ORDER — ROCURONIUM BROMIDE 100 MG/10ML IV SOLN
INTRAVENOUS | Status: DC | PRN
  Administered 2017-03-04: 50 mg via INTRAVENOUS

## 2017-03-04 MED ORDER — ROCURONIUM BROMIDE 50 MG/5ML IV SOLN
INTRAVENOUS | Status: AC
  Filled 2017-03-04: qty 1

## 2017-03-04 MED ORDER — ONDANSETRON HCL 4 MG/2ML IJ SOLN
INTRAMUSCULAR | Status: DC | PRN
  Administered 2017-03-04: 4 mg via INTRAVENOUS

## 2017-03-04 MED ORDER — LIDOCAINE HCL (PF) 2 % IJ SOLN
INTRAMUSCULAR | Status: AC
  Filled 2017-03-04: qty 2

## 2017-03-04 MED ORDER — DEXAMETHASONE SODIUM PHOSPHATE 10 MG/ML IJ SOLN
INTRAMUSCULAR | Status: DC | PRN
  Administered 2017-03-04: 10 mg via INTRAVENOUS

## 2017-03-04 MED ORDER — ACETAMINOPHEN 500 MG PO TABS
1000.0000 mg | ORAL_TABLET | ORAL | Status: AC
  Administered 2017-03-04: 1000 mg via ORAL

## 2017-03-04 MED ORDER — SUGAMMADEX SODIUM 200 MG/2ML IV SOLN
INTRAVENOUS | Status: AC
  Filled 2017-03-04: qty 2

## 2017-03-04 MED ORDER — OXYCODONE HCL 5 MG PO TABS: 5.0000 mg | ORAL_TABLET | Freq: Four times a day (QID) | ORAL | 0 refills | Status: DC | PRN

## 2017-03-04 MED ORDER — FENTANYL CITRATE (PF) 100 MCG/2ML IJ SOLN: 25.0000 ug | INTRAMUSCULAR | Status: DC | PRN

## 2017-03-04 MED ORDER — SUGAMMADEX SODIUM 200 MG/2ML IV SOLN
INTRAVENOUS | Status: DC | PRN
  Administered 2017-03-04: 140 mg via INTRAVENOUS

## 2017-03-04 MED ORDER — LACTATED RINGERS IV SOLN
INTRAVENOUS | Status: DC
  Administered 2017-03-04 (×2): via INTRAVENOUS

## 2017-03-04 MED ORDER — ACETAMINOPHEN 10 MG/ML IV SOLN
INTRAVENOUS | Status: DC | PRN
  Administered 2017-03-04: 1000 mg via INTRAVENOUS

## 2017-03-04 SURGICAL SUPPLY — 43 items
APPLIER CLIP 5 13 M/L LIGAMAX5 (MISCELLANEOUS) ×3
BLADE SURG 15 STRL LF DISP TIS (BLADE) ×1 IMPLANT
BLADE SURG 15 STRL SS (BLADE) ×2
CANISTER SUCT 1200ML W/VALVE (MISCELLANEOUS) ×3 IMPLANT
CATH CHOLANGI 4FR 420404F (CATHETERS) IMPLANT
CHLORAPREP W/TINT 26ML (MISCELLANEOUS) ×3 IMPLANT
CLIP APPLIE 5 13 M/L LIGAMAX5 (MISCELLANEOUS) ×1 IMPLANT
CONRAY 60ML FOR OR (MISCELLANEOUS) IMPLANT
DERMABOND ADVANCED (GAUZE/BANDAGES/DRESSINGS) ×2
DERMABOND ADVANCED .7 DNX12 (GAUZE/BANDAGES/DRESSINGS) ×1 IMPLANT
DRAPE C-ARM XRAY 36X54 (DRAPES) IMPLANT
ELECT REM PT RETURN 9FT ADLT (ELECTROSURGICAL) ×3
ELECTRODE REM PT RTRN 9FT ADLT (ELECTROSURGICAL) ×1 IMPLANT
ENDOPOUCH RETRIEVER 10 (MISCELLANEOUS) ×3 IMPLANT
FILTER LAP SMOKE EVAC STRL (MISCELLANEOUS) ×3 IMPLANT
GLOVE SURG SYN 7.0 (GLOVE) ×3 IMPLANT
GLOVE SURG SYN 7.5  E (GLOVE) ×2
GLOVE SURG SYN 7.5 E (GLOVE) ×1 IMPLANT
GOWN STRL REUS W/ TWL LRG LVL3 (GOWN DISPOSABLE) ×3 IMPLANT
GOWN STRL REUS W/TWL LRG LVL3 (GOWN DISPOSABLE) ×6
IRRIGATION STRYKERFLOW (MISCELLANEOUS) ×1 IMPLANT
IRRIGATOR STRYKERFLOW (MISCELLANEOUS) ×3
IV CATH ANGIO 12GX3 LT BLUE (NEEDLE) ×3 IMPLANT
IV NS 1000ML (IV SOLUTION) ×2
IV NS 1000ML BAXH (IV SOLUTION) ×1 IMPLANT
JACKSON PRATT 10 (INSTRUMENTS) IMPLANT
L-HOOK LAP DISP 36CM (ELECTROSURGICAL) ×3
LABEL OR SOLS (LABEL) ×3 IMPLANT
LHOOK LAP DISP 36CM (ELECTROSURGICAL) ×1 IMPLANT
NDL SAFETY 22GX1.5 (NEEDLE) ×3 IMPLANT
NEEDLE HYPO 22GX1.5 SAFETY (NEEDLE) ×3 IMPLANT
PACK LAP CHOLECYSTECTOMY (MISCELLANEOUS) ×3 IMPLANT
PENCIL ELECTRO HAND CTR (MISCELLANEOUS) ×3 IMPLANT
SCISSORS METZENBAUM CVD 33 (INSTRUMENTS) ×3 IMPLANT
SLEEVE ADV FIXATION 5X100MM (TROCAR) ×9 IMPLANT
SPONGE VERSALON 4X4 4PLY (MISCELLANEOUS) IMPLANT
SUT MNCRL 4-0 (SUTURE) ×2
SUT MNCRL 4-0 27XMFL (SUTURE) ×1
SUT VICRYL 0 AB UR-6 (SUTURE) ×3 IMPLANT
SUTURE MNCRL 4-0 27XMF (SUTURE) ×1 IMPLANT
TROCAR 130MM GELPORT  DAV (MISCELLANEOUS) ×3 IMPLANT
TROCAR Z-THREAD OPTICAL 5X100M (TROCAR) ×3 IMPLANT
TUBING INSUFFLATOR HI FLOW (MISCELLANEOUS) ×3 IMPLANT

## 2017-03-04 NOTE — Interval H&P Note (Signed)
History and Physical Interval Note:  03/04/2017 12:30 PM  Madeline White  has presented today for surgery, with the diagnosis of CHOLELITHIASIS  The various methods of treatment have been discussed with the patient and family. After consideration of risks, benefits and other options for treatment, the patient has consented to  Procedure(s): LAPAROSCOPIC CHOLECYSTECTOMY (N/A) as a surgical intervention .  The patient's history has been reviewed, patient examined, no change in status, stable for surgery.  I have reviewed the patient's chart and labs.  Questions were answered to the patient's satisfaction.     Jourdain Guay

## 2017-03-04 NOTE — Anesthesia Post-op Follow-up Note (Cosign Needed)
Anesthesia QCDR form completed.        

## 2017-03-04 NOTE — Op Note (Signed)
  Procedure Date:  03/04/2017  Pre-operative Diagnosis:  Symptomatic cholelithiasis  Post-operative Diagnosis:  Symptomatic cholelithiasis  Procedure:  Laparoscopic cholecystectomy  Surgeon:  Howie IllJose Luis Terrin Meddaugh, MD  Anesthesia:  General endotracheal  Estimated Blood Loss:  25 ml  Specimens:  gallbladder  Complications:  None  Indications for Procedure:  This is a 31 y.o. female who presents with abdominal pain and workup revealing symptomatic cholelithiasis.  The benefits, complications, treatment options, and expected outcomes were discussed with the patient. The risks of bleeding, infection, recurrence of symptoms, failure to resolve symptoms, bile duct damage, bile duct leak, retained common bile duct stone, bowel injury, and need for further procedures were all discussed with the patient and she was willing to proceed.  Description of Procedure: The patient was correctly identified in the preoperative area and brought into the operating room.  The patient was placed supine with VTE prophylaxis in place.  Appropriate time-outs were performed.  Anesthesia was induced and the patient was intubated.  Appropriate antibiotics were infused.  The abdomen was prepped and draped in a sterile fashion. An infraumbilical incision was made. A cutdown technique was used to enter the abdominal cavity without injury, and a Hasson trocar was inserted.  Pneumoperitoneum was obtained with appropriate opening pressures.  A 5-mm port was placed in the subxiphoid area and two 5-mm ports were placed in the right upper quadrant under direct visualization.  The gallbladder was identified.  The fundus was grasped and retracted cephalad.  Adhesions were lysed bluntly and with electrocautery. The infundibulum was grasped and retracted laterally, exposing the peritoneum overlying the gallbladder.  This was incised with electrocautery and extended on either side of the gallbladder.  The cystic duct and both anterior and  posterior branches of the cystic artery were clearly identified and bluntly dissected.  All were clipped twice proximally and once distally, cutting in between.  The gallbladder was taken from the gallbladder fossa in a retrograde fashion with electrocautery. The gallbladder was placed in an Endocatch bag and brought out via the umbilical incision. The liver bed was inspected and any bleeding was controlled with electrocautery. The right upper quadrant was then inspected again revealing intact clips, no bleeding, and no ductal injury.  The area was thoroughly irrigated.  The 5 mm ports were removed under direct visualization and the Hasson trocar was removed.  The fascial opening was closed using 0 vicryl suture.  Local anesthetic was infused in all incisions and the incisions were closed with 4-0 Monocryl.  The wounds were cleaned and sealed with DermaBond.  The patient was emerged from anesthesia and extubated and brought to the recovery room for further management.  The patient tolerated the procedure well and all counts were correct at the end of the case.   Howie IllJose Luis Sahily Biddle, MD

## 2017-03-04 NOTE — Transfer of Care (Signed)
Immediate Anesthesia Transfer of Care Note  Patient: Madeline White  Procedure(s) Performed: Procedure(s): LAPAROSCOPIC CHOLECYSTECTOMY (N/A)  Patient Location: PACU  Anesthesia Type:General  Level of Consciousness: sedated  Airway & Oxygen Therapy: Patient Spontanous Breathing and Patient connected to face mask oxygen  Post-op Assessment: Report given to RN and Post -op Vital signs reviewed and stable  Post vital signs: Reviewed and stable  Last Vitals:  Vitals:   03/04/17 1247 03/04/17 1613  BP: 116/69 (!) 108/57  Pulse: 76 65  Resp: 14 19  Temp: 37 C 37.1 C    Last Pain:  Vitals:   03/04/17 1613  TempSrc:   PainSc: Asleep         Complications: No apparent anesthesia complications

## 2017-03-04 NOTE — H&P (View-Only) (Signed)
02/18/2017  Reason for Visit:  Symptomatic cholelithiasis  History of Present Illness: Madeline White is a 31 y.o. female who presents with a two-month history of right upper quadrant abdominal pain associated with nausea. Patient reports that she had a history of gastric bypass 2 years ago and has lost approximately 150 pounds. However 2 months ago she started developing nausea and right upper quadrant abdominal pain after eating. Now she is getting this after every meal and it does not matter what is she is eating. She denies having any fevers or chills. She initially went to her PCP and lab work was done as well as an ultrasound. The ultrasound showed cholelithiasis with a mildly dilated common bile duct but no evidence of choledocholithiasis. She was then seen by Dr. Servando Snare with gastroenterology and an EGD was obtained which was negative for any marginal ulcers. MRCP was then done which showed cholelithiasis with no choledocholithiasis and no evidence of cholecystitis. She now presents for evaluation for possible surgical management.  She describes the pain as stabbing at times but does subside. The pain is located in the right upper quadrant and does not radiate anywhere else. There are no other areas of abdominal pain. She has not had any vomiting but is doing dry heaving. She is taking Zofran which is helping her for her nausea control.  Past Medical History: Past Medical History:  Diagnosis Date  . Anxiety   . Arthritis    lower back, knees  . Bipolar affective (HCC) 08/12/2016  . Bipolar affective disorder (HCC)   . Cardiac arrhythmia due to congenital heart disease   . Degenerative disc disease, lumbar   . Family history of adverse reaction to anesthesia    Mopther - "aspirates every time"  . GERD (gastroesophageal reflux disease)   . Intractable vomiting with nausea   . Palpitations 08/24/2016  . Poor concentration 01/14/2017  . Rash and nonspecific skin eruption 10/01/2016  . Sleep  apnea 10/10/2013  . SVT (supraventricular tachycardia) (HCC) 2006   had been on metoprolol 10mg  but stopped taking     Past Surgical History: Past Surgical History:  Procedure Laterality Date  . CESAREAN SECTION     x2  . ESOPHAGOGASTRODUODENOSCOPY (EGD) WITH PROPOFOL N/A 01/28/2017   Procedure: ESOPHAGOGASTRODUODENOSCOPY (EGD) WITH PROPOFOL;  Surgeon: Midge Minium, MD;  Location: [facility];  Service: Endoscopy;  Laterality: N/A;  . GASTRIC BYPASS      Home Medications: Prior to Admission medications   Medication Sig Start Date End Date Taking? Authorizing Provider  aspirin 81 MG chewable tablet Chew by mouth daily.   Yes [provider]  Biotin 1000 MCG tablet Take 1,000 mcg by mouth 3 (three) times daily.   Yes [provider]  cholecalciferol (VITAMIN D) 1000 units tablet Take 2,000 Units by mouth daily.   Yes [provider]  cyanocobalamin 2000 MCG tablet Take 2,500 mcg by mouth daily.   Yes [provider]  cyclobenzaprine (FLEXERIL) 10 MG tablet Take 1 tablet (10 mg total) by mouth at bedtime as needed for muscle spasms. 01/18/17  Yes Arnett, Lyn Records, FNP  DULoxetine (CYMBALTA) 30 MG capsule Take 1 capsule (30 mg total) by mouth daily. 02/10/17 02/10/18 Yes Ravi, Himabindu, MD  famotidine (PEPCID) 20 MG tablet Take 20 mg by mouth daily.   Yes [provider]  metoprolol succinate (TOPROL-XL) 25 MG 24 hr tablet Take 1 tablet (25 mg total) by mouth 2 (two) times daily. 01/20/17  Yes Allegra Grana,  FNP  omeprazole (PRILOSEC) 20 MG capsule Take 20 mg by mouth daily.   Yes [provider]  ondansetron (ZOFRAN) 4 MG tablet Take 1 tablet (4 mg total) by mouth every 6 (six) hours as needed for nausea or vomiting. 02/08/17  Yes Midge Minium, MD  pantoprazole (PROTONIX) 20 MG tablet Take 1 tablet (20 mg total) by mouth daily. 01/20/17  Yes Arnett, Lyn Records, FNP  predniSONE (DELTASONE) 10 MG tablet Take 4 tablets ( total 40 mg)  by mouth for 2 days; take 3 tablets ( total 30 mg) by mouth for 2 days; take 2 tablets (total 20mg ) by mouth for 2 days; then take 1 tablet ( total 10mg ) by mouth for 2 days. 02/17/17  Yes Allegra Grana, FNP  traZODone (DESYREL) 100 MG tablet Take 1 tablet (100 mg total) by mouth at bedtime. 12/30/16  Yes Patrick North, MD    Allergies: Allergies  Allergen Reactions  . Penicillins Hives    Social History:  reports that she has never smoked. She has never used smokeless tobacco. She reports that she does not drink alcohol or use drugs.   Family History: Family History  Problem Relation Age of Onset  . Hyperlipidemia Mother   . Diabetes Mother   . Hyperlipidemia Father   . Heart disease Maternal Grandmother   . Diabetes Maternal Grandmother   . Heart disease Maternal Grandfather   . Diabetes Maternal Grandfather   . Heart disease Paternal Grandmother   . Diabetes Paternal Grandmother   . Alcohol abuse Paternal Grandfather   . Heart disease Paternal Grandfather   . Diabetes Paternal Grandfather     Review of Systems: Review of Systems  Constitutional: Negative for chills and fever.  HENT: Negative for hearing loss.   Eyes: Negative for blurred vision.  Respiratory: Negative for cough.   Cardiovascular: Negative for chest pain.  Gastrointestinal: Positive for abdominal pain and nausea. Negative for constipation, diarrhea and vomiting.  Genitourinary: Negative for dysuria.  Musculoskeletal: Negative for myalgias.  Skin: Negative for rash.  Neurological: Negative for dizziness.  Psychiatric/Behavioral: Negative for depression.  All other systems reviewed and are negative.   Physical Exam BP 113/70   Pulse 83   Temp 98.3 F (36.8 C) (Oral)   Ht 5\' 6"  (1.676 m)   Wt 70.6 kg (155 lb 9.6 oz)   BMI 25.11 kg/m  CONSTITUTIONAL: No acute distress HEENT:  Normocephalic, atraumatic, extraocular motion intact. NECK: Trachea is midline, and there is no jugular venous  distension.  RESPIRATORY:  Lungs are clear, and breath sounds are equal bilaterally. Normal respiratory effort without pathologic use of accessory muscles. CARDIOVASCULAR: Heart is regular without murmurs, gallops, or rubs. GI: The abdomen is soft, nondistended, with mild tenderness to palpation in the right upper quadrant. He is Murphy sign. There were no palpable masses. All incisions from her gastric bypass are well-healed. MUSCULOSKELETAL:  Normal muscle strength and tone in all four extremities.  No peripheral edema or cyanosis. SKIN: Skin turgor is normal. There are no pathologic skin lesions.  NEUROLOGIC:  Motor and sensation is grossly normal.  Cranial nerves are grossly intact. PSYCH:  Alert and oriented to person, place and time. Affect is normal.  Laboratory Analysis: Lab work done on 01/18/17 shows a normal white blood cell count of 8.2 with a total bilirubin of 0.3, AST 16, a LT 67, alkaline phosphatase 42, lipase 39.  Imaging: Ultrasound on 4/30 shows multiple gallstones with no evidence of acute cholecystitis and a common  bile duct measuring 7-8 mm. Follow-up MRCP on 5/19 shows again cholelithiasis but no choledocholithiasis.  Assessment and Plan: This is a 31 y.o. female who presents with symptomatic cholelithiasis. I have independently reviewed the patient's laboratory and imaging workup. There is no evidence of acute cholecystitis on her studies but she does have cholelithiasis and no choledocholithiasis as noted on her MRI.  Discussed with the patient that given the findings and a negative EGD, most likely her gallbladder is was the cause of her abdominal symptoms. Scars with her the surgical management for symptomatic cholelithiasis which is cholecystectomy. Scars with her that we would attempt laparoscopic cholecystectomy first year given that she's had this symptoms for approximate 2 months, her gallbladder may be too inflamed and discussed with her the possibility of needing to  convert to an open procedure in order to prevent any complications. Also discussed with the patient the risks of bleeding, infection, and injury to surrounding structures. All of her questions have been answered and she is willing to proceed with surgery. She will be scheduled for 6/14.  Face-to-face time spent with the patient and care providers was 45 minutes, with more than 50% of the time spent counseling, educating, and coordinating care of the patient.     Howie IllJose Luis Latanja Lehenbauer, MD [facility]

## 2017-03-04 NOTE — Anesthesia Procedure Notes (Signed)
Procedure Name: Intubation Date/Time: 03/04/2017 2:26 PM Performed by: Dionne Bucy Pre-anesthesia Checklist: Patient identified, Patient being monitored, Timeout performed, Emergency Drugs available and Suction available Patient Re-evaluated:Patient Re-evaluated prior to inductionOxygen Delivery Method: Circle system utilized Preoxygenation: Pre-oxygenation with 100% oxygen Intubation Type: IV induction Ventilation: Mask ventilation without difficulty Laryngoscope Size: Mac and 3 Grade View: Grade I Tube type: Oral Tube size: 7.0 mm Number of attempts: 1 Airway Equipment and Method: Stylet Placement Confirmation: ETT inserted through vocal cords under direct vision,  positive ETCO2 and breath sounds checked- equal and bilateral Secured at: 21 cm Tube secured with: Tape Dental Injury: Teeth and Oropharynx as per pre-operative assessment

## 2017-03-04 NOTE — Anesthesia Preprocedure Evaluation (Addendum)
Anesthesia Evaluation  Patient identified by MRN, date of birth, ID band Patient awake    Reviewed: Allergy & Precautions, H&P , NPO status , Patient's Chart, lab work & pertinent test results  Airway Mallampati: II  TM Distance: >3 FB     Dental  (+) Teeth Intact, Chipped   Pulmonary neg pulmonary ROS, sleep apnea ,    breath sounds clear to auscultation       Cardiovascular + dysrhythmias (takes metoprolol) Supra Ventricular Tachycardia  Rhythm:regular Rate:Normal     Neuro/Psych PSYCHIATRIC DISORDERS Anxiety Bipolar Disorder    GI/Hepatic Neg liver ROS, GERD  Medicated,  Endo/Other  negative endocrine ROS  Renal/GU negative Renal ROS  negative genitourinary   Musculoskeletal  (+) Arthritis ,   Abdominal   Peds negative pediatric ROS (+)  Hematology negative hematology ROS (+)   Anesthesia Other Findings Past Medical History: No date: Anxiety No date: Arthritis     Comment: lower back, knees 08/12/2016: Bipolar affective (HCC) No date: Bipolar affective disorder (HCC) No date: Cardiac arrhythmia due to congenital heart dis* No date: Degenerative disc disease, lumbar No date: Family history of adverse reaction to anesthes*     Comment: Mopther - "aspirates every time" No date: GERD (gastroesophageal reflux disease) No date: Intractable vomiting with nausea 08/24/2016: Palpitations 01/14/2017: Poor concentration 10/01/2016: Rash and nonspecific skin eruption 10/10/2013: Sleep apnea 2006: SVT (supraventricular tachycardia) (HCC)     Comment: had been on metoprolol 10mg  but stopped taking   No Hx of congenital heart disease per patient  Reproductive/Obstetrics                            Anesthesia Physical  Anesthesia Plan  ASA: II  Anesthesia Plan: General   Post-op Pain Management:    Induction: Intravenous  PONV Risk Score and Plan: 4 or greater and Ondansetron,  Dexamethasone, Propofol, Midazolam and Scopolamine patch - Pre-op  Airway Management Planned: Oral ETT  Additional Equipment:   Intra-op Plan:   Post-operative Plan: Extubation in OR  Informed Consent: I have reviewed the patients History and Physical, chart, labs and discussed the procedure including the risks, benefits and alternatives for the proposed anesthesia with the patient or authorized representative who has indicated his/her understanding and acceptance.     Plan Discussed with: CRNA and Surgeon  Anesthesia Plan Comments:         Anesthesia Quick Evaluation

## 2017-03-04 NOTE — Anesthesia Postprocedure Evaluation (Signed)
Anesthesia Post Note  Patient: Luanna Salkicole Demedeiros  Procedure(s) Performed: Procedure(s) (LRB): LAPAROSCOPIC CHOLECYSTECTOMY (N/A)  Patient location during evaluation: PACU Anesthesia Type: General Level of consciousness: awake and alert Pain management: pain level controlled Vital Signs Assessment: post-procedure vital signs reviewed and stable Respiratory status: spontaneous breathing, nonlabored ventilation, respiratory function stable and patient connected to nasal cannula oxygen Cardiovascular status: blood pressure returned to baseline and stable Postop Assessment: no signs of nausea or vomiting Anesthetic complications: no     Last Vitals:  Vitals:   03/04/17 1712 03/04/17 1748  BP: 113/68 121/68  Pulse: 67 77  Resp: 16 16  Temp: 36.3 C     Last Pain:  Vitals:   03/04/17 1748  TempSrc:   PainSc: 4                  Lenard SimmerAndrew Katniss Weedman

## 2017-03-04 NOTE — Discharge Instructions (Signed)

## 2017-03-05 ENCOUNTER — Encounter: Payer: Self-pay | Admitting: Surgery

## 2017-03-05 ENCOUNTER — Other Ambulatory Visit: Payer: Self-pay | Admitting: General Practice

## 2017-03-05 MED ORDER — ONDANSETRON 4 MG PO TBDP: 4.0000 mg | ORAL_TABLET | Freq: Three times a day (TID) | ORAL | 0 refills | Status: DC | PRN

## 2017-03-05 MED ORDER — HYDROCODONE-ACETAMINOPHEN 5-325 MG PO TABS: 1.0000 | ORAL_TABLET | Freq: Four times a day (QID) | ORAL | 0 refills | Status: DC | PRN

## 2017-03-05 NOTE — Telephone Encounter (Signed)
Spoke with Dr. Tonita CongWoodham in regards to this patient. He has prescribed Norco 5/325mg  and has sent a refill of her Zofran to the [facility].   Returned phone call to patient. I explained that she will d/c the oxycodone, begin taking the Norco today as prescribed, she is not to take any additional Tylenol with this medication, I recommend her to drink 72 oz of water daily and to walk as much as possible to help with Carbon Dioxide absorption which I attribute to this time of pain. Patient states that [facility].

## 2017-03-05 NOTE — Telephone Encounter (Signed)
Patient called she had surgery done 03/04/17 with Dr. Aleen CampiPiscoya, and has a follow up appointment on 03/15/17. She is calling asking for a different pain medication also asking for a refill on Zofran. Please call patient and advice.

## 2017-03-08 ENCOUNTER — Ambulatory Visit (INDEPENDENT_AMBULATORY_CARE_PROVIDER_SITE_OTHER): Payer: 59 | Admitting: Psychiatry

## 2017-03-08 ENCOUNTER — Other Ambulatory Visit: Payer: Self-pay

## 2017-03-08 ENCOUNTER — Telehealth: Payer: Self-pay

## 2017-03-08 ENCOUNTER — Encounter: Payer: Self-pay | Admitting: Surgery

## 2017-03-08 ENCOUNTER — Encounter: Payer: Self-pay | Admitting: Psychiatry

## 2017-03-08 ENCOUNTER — Encounter: Payer: 59 | Admitting: Surgery

## 2017-03-08 VITALS — BP 125/78 | HR 87 | Temp 98.5°F | Wt 164.2 lb

## 2017-03-08 DIAGNOSIS — F330 Major depressive disorder, recurrent, mild: Secondary | ICD-10-CM

## 2017-03-08 DIAGNOSIS — F411 Generalized anxiety disorder: Secondary | ICD-10-CM

## 2017-03-08 LAB — SURGICAL PATHOLOGY

## 2017-03-08 MED ORDER — TRAZODONE HCL 50 MG PO TABS: 100.0000 mg | ORAL_TABLET | Freq: Every day | ORAL | 1 refills | Status: DC

## 2017-03-08 MED ORDER — BUSPIRONE HCL 5 MG PO TABS: 5.0000 mg | ORAL_TABLET | Freq: Two times a day (BID) | ORAL | 1 refills | Status: AC

## 2017-03-08 MED ORDER — DULOXETINE HCL 30 MG PO CPEP: 30.0000 mg | ORAL_CAPSULE | ORAL | 1 refills | Status: DC

## 2017-03-08 NOTE — Telephone Encounter (Signed)
-----   Message -----    From: Luanna SalkNicole Blumenfeld    Sent: 03/08/2017  9:30 AM EDT      To: Henrene DodgeJose Piscoya, MD Subject: Visit Follow-Up Question  Hey Dr. Aleen CampiPiscoya, I got the prescription the new prescription that you sent me and it's makes feel loopy and weird. Is there any way I can do The Roxicodone instead of the new one? I don't if it was too strong or what but it makes me feel out of it all day. I turned my other prescription in to a safe surrender site for medications cause we were cleaning up to get ready for our trip and we took our old prescriptions over there so that the kids didn't get into them. I was also hoping to get a refill on the oral Zofran instead of the disintegrating ones. Thanks in advanced for everything.   -Luanna Salkicole Pola    Called patient back and she stated that she was given Hydrocodone-Acetaminophen 5-325 MG when she was discharged. However, patient is stating that this medication is making her feel "loopy and weird". She is requesting to get Roxicodone 5MG  again since she did not have this feeling with it.  I asked patient how she was feeling and she stated that she was having right upper quadrant pain at times therefore she needed to take something for her pain. Patient denied having a fever, chills, redness on her incision site, or drainage.  Patient stated that her appetite was getting better and her bowel movements were moving fine.  I told patient that Dr. Everlene FarrierPabon recommended for her to alternate Ibuprofen and Tylenol for her pain. Patient stated that she was not able to take Ibuprofen because she had a gastric bypass surgery. So, since she still wanted the Roxicet, I told her that the surgeon needed to see her to assess her pain. Patient agreed and she will be here today at 4:00 PM.

## 2017-03-08 NOTE — Progress Notes (Signed)
Psychiatric progress note  Patient Identification: Madeline White MRN:  [id number] Date of Evaluation:  03/08/2017 Referral Source:Margaret Arnett NP Chief Complaint:  Improved anxiety and mood Chief Complaint    Follow-up; Medication Refill     Visit Diagnosis:    ICD-10-CM   1. GAD (generalized anxiety disorder) F41.1   2. MDD (major depressive disorder), recurrent episode, mild (HCC) F33.0     History of Present Illness:  Patient was seen for a follow-up of her anxiety today. She has tolerated the cymbalta well at 30mg  daily. States her energy has improved, her mood has significantly improved. Feels her functioning could be improved furthur, states she gets tired by the end of the day. She continues to be anxious in public.  She is looking forward to a roadtrip to [place] next week. Denies any suicidal thoughts.    Past Psychiatric History: None  Previous Psychotropic Medications: Zoloft, Prozac, abilify, seroquel  Substance Abuse History in the last 12 months:  No.  Consequences of Substance Abuse: Negative  Past Medical History:  Past Medical History:  Diagnosis Date  . Anxiety   . Arthritis    lower back, knees  . Bipolar affective (HCC) 08/12/2016  . Bipolar affective disorder (HCC)   . Cardiac arrhythmia due to congenital heart disease   . Degenerative disc disease, lumbar   . Family history of adverse reaction to anesthesia    Mopther - "aspirates every time"  . GERD (gastroesophageal reflux disease)   . Intractable vomiting with nausea   . Palpitations 08/24/2016  . Poor concentration 01/14/2017  . Rash and nonspecific skin eruption 10/01/2016  . Sleep apnea 10/10/2013  . SVT (supraventricular tachycardia) (HCC) 2006   had been on metoprolol 10mg  but stopped taking    Past Surgical History:  Procedure Laterality Date  . CESAREAN SECTION     x2  . CHOLECYSTECTOMY N/A 03/04/2017   Procedure: LAPAROSCOPIC CHOLECYSTECTOMY;  Surgeon: Henrene Dodge, MD;   Location: [facility];  Service: General;  Laterality: N/A;  . ESOPHAGOGASTRODUODENOSCOPY (EGD) WITH PROPOFOL N/A 01/28/2017   Procedure: ESOPHAGOGASTRODUODENOSCOPY (EGD) WITH PROPOFOL;  Surgeon: Midge Minium, MD;  Location: [facility];  Service: Endoscopy;  Laterality: N/A;  . GASTRIC BYPASS      Family Psychiatric History: none  Family History:  Family History  Problem Relation Age of Onset  . Hyperlipidemia Mother   . Diabetes Mother   . Hyperlipidemia Father   . Heart disease Maternal Grandmother   . Diabetes Maternal Grandmother   . Heart disease Maternal Grandfather   . Diabetes Maternal Grandfather   . Heart disease Paternal Grandmother   . Diabetes Paternal Grandmother   . Alcohol abuse Paternal Grandfather   . Heart disease Paternal Grandfather   . Diabetes Paternal Grandfather     Social History:   Social History   Social History  . Marital status: Married    Spouse name: N/A  . Number of children: N/A  . Years of education: N/A   Social History Main Topics  . Smoking status: Never Smoker  . Smokeless tobacco: Never Used  . Alcohol use No  . Drug use: No  . Sexual activity: Yes    Birth control/ protection: IUD   Other Topics Concern  . None   Social History Narrative   From [place] and grew up here .      Married.      Just moved to from [facility].       2 children, 62yrs- boy and  15 months- girl      Staying home.       Husband is Vet and has PTSD.       Caring for mother.       Had been a CMA at [employer].     Additional Social History:   Allergies:   Allergies  Allergen Reactions  . Penicillins Hives    Has patient had a PCN reaction causing immediate rash, facial/tongue/throat swelling, SOB or lightheadedness with hypotension: No Has patient had a PCN reaction causing severe rash involving mucus membranes or skin necrosis: No Has patient had a PCN reaction that required hospitalization: No Has patient had a PCN reaction  occurring within the last 10 years: No If all of the above answers are "NO", then may proceed with Cephalosporin use.     Metabolic Disorder Labs: No results found for: HGBA1C, MPG No results found for: PROLACTIN No results found for: CHOL, TRIG, HDL, CHOLHDL, VLDL, LDLCALC   Current Medications: Current Outpatient Prescriptions  Medication Sig Dispense Refill  . aspirin 81 MG chewable tablet Chew by mouth daily.    . Biotin 1000 MCG tablet Take 1,000 mcg by mouth daily.     . cholecalciferol (VITAMIN D) 1000 units tablet Take 2,000 Units by mouth daily.    . cyanocobalamin 2000 MCG tablet Take 2,500 mcg by mouth daily.    . cyclobenzaprine (FLEXERIL) 10 MG tablet Take 1 tablet (10 mg total) by mouth at bedtime as needed for muscle spasms. 30 tablet 0  . DULoxetine (CYMBALTA) 30 MG capsule Take 1 capsule (30 mg total) by mouth daily. (Patient taking differently: Take 30 mg by mouth every morning. ) 30 capsule 1  . HYDROcodone-acetaminophen (NORCO) 5-325 MG tablet Take 1-2 tablets by mouth every 6 (six) hours as needed for moderate pain or severe pain. 30 tablet 0  . levonorgestrel (MIRENA) 20 MCG/24HR IUD 1 each by Intrauterine route once.    . metoprolol succinate (TOPROL-XL) 25 MG 24 hr tablet Take 1 tablet (25 mg total) by mouth 2 (two) times daily. 60 tablet 3  . ondansetron (ZOFRAN-ODT) 4 MG disintegrating tablet Take 1 tablet (4 mg total) by mouth every 8 (eight) hours as needed for nausea or vomiting. 20 tablet 0  . oxyCODONE (OXY IR/ROXICODONE) 5 MG immediate release tablet Take 1-2 tablets (5-10 mg total) by mouth every 6 (six) hours as needed for severe pain. 30 tablet 0  . pantoprazole (PROTONIX) 20 MG tablet Take 1 tablet (20 mg total) by mouth daily. (Patient taking differently: Take 20 mg by mouth every morning. ) 90 tablet 1  . traZODone (DESYREL) 100 MG tablet Take 1 tablet (100 mg total) by mouth at bedtime. 30 tablet 1   No current facility-administered medications for  this visit.     Neurologic: Headache: No Seizure: No Paresthesias:No  Musculoskeletal: Strength & Muscle Tone: within normal limits Gait & Station: normal Patient leans: N/A  Psychiatric Specialty Exam: ROS  Blood pressure 125/78, pulse 87, temperature 98.5 F (36.9 C), temperature source Oral, weight 164 lb 3.2 oz (74.5 kg).Body mass index is 26.5 kg/m.  General Appearance: Casual  Eye Contact:  Fair  Speech:  Clear and Coherent  Volume:  Normal  Mood:  improved   Affect:  Congruent  Thought Process:  Coherent  Orientation:  Full (Time, Place, and Person)  Thought Content:  Logical  Suicidal Thoughts:  No  Homicidal Thoughts:  No  Memory:  Immediate;   Fair Recent;   Fair  Remote;   Fair  Judgement:  Fair  Insight:  Fair  Psychomotor Activity:  Normal  Concentration:  Concentration: Fair and Attention Span: Fair  Recall:  [employer] of Knowledge:Fair  Language: Fair  Akathisia:  No  Handed:  Right  AIMS (if indicated):  na  Assets:  Communication Skills Desire for Improvement Financial Resources/Insurance Physical Health Social Support  ADL's:  Intact  Cognition: WNL  Sleep:  Fair with trazodone 100mg     Treatment Plan Summary   Generalized anxiety disorder  Continue Cymbalta to 30mg  po qam. Start buspar at 5mg  po bid.   Insomnia Continue the trazodone at 100 mg at bedtime.  Continue to follow-up with Dr. Elna Breslow for evaluation for ADHD.  Return to clinic in 4 weeks time or call before if needed     Patrick North, MD 6/18/20181:09 PM

## 2017-03-09 ENCOUNTER — Ambulatory Visit (INDEPENDENT_AMBULATORY_CARE_PROVIDER_SITE_OTHER): Payer: 59 | Admitting: Surgery

## 2017-03-09 ENCOUNTER — Encounter: Payer: Self-pay | Admitting: Surgery

## 2017-03-09 VITALS — BP 134/83 | HR 74 | Temp 98.8°F | Wt 162.0 lb

## 2017-03-09 DIAGNOSIS — Z09 Encounter for follow-up examination after completed treatment for conditions other than malignant neoplasm: Secondary | ICD-10-CM

## 2017-03-09 MED ORDER — ONDANSETRON HCL 4 MG PO TABS: 4.0000 mg | ORAL_TABLET | Freq: Three times a day (TID) | ORAL | 0 refills | Status: DC | PRN

## 2017-03-09 MED ORDER — GABAPENTIN 300 MG PO CAPS: 300.0000 mg | ORAL_CAPSULE | Freq: Three times a day (TID) | ORAL | 0 refills | Status: DC

## 2017-03-09 NOTE — Progress Notes (Signed)
S/p lap chole by Dr. Piscoya 6/1Aleen Campi4 C/o some periumbilical pain and persistent RUQ pain She clinically looks very good and in no distress or any pain Some nausea, no fevers or biliary obstruction She tell me than the day she was given the oxycodone apparently they " cleaned the medication cabinet "  And the oxycodone was amongst the medication they disposed. She was given norco by Dr. Tonita CongWoodham and apparently is "not helping" I had a discussion with the patient detail about my concerns that the patient might be abuse in the oxycodone and at that I would not be able to prescribe any narcotics at this time. I offer her to do cold compresses and may prescribe a short course of gabapentin but no narcotics whatsoever.  PE NAD Abd: incisions c/d/i, no peritonitis  A/P  Pain after surgery Clinically no evidence of complications from procedure No narcotics will be prescribe zofran for nausea F/W next week as previously scheduled

## 2017-03-09 NOTE — Patient Instructions (Signed)
Please call our office with any questions or concerns.  Please do not submerge in a tub, hot tub, or pool until incisions are completely sealed.  Use sun block to incision area over the next year if this area will be exposed to sun. This helps decrease scarring.  You may resume your normal activities on . At that time- Listen to your body when lifting, if you have pain when lifting, stop and then try again in a few days. Pain after doing exercises or activities of daily living is normal as you get back in to your normal routine.  If you develop redness, drainage, or pain at incision sites- call our office immediately and speak with a nurse.

## 2017-03-15 ENCOUNTER — Ambulatory Visit (INDEPENDENT_AMBULATORY_CARE_PROVIDER_SITE_OTHER): Payer: 59 | Admitting: Surgery

## 2017-03-15 ENCOUNTER — Encounter: Payer: Self-pay | Admitting: Surgery

## 2017-03-15 ENCOUNTER — Other Ambulatory Visit: Payer: Self-pay

## 2017-03-15 VITALS — BP 127/80 | HR 98 | Temp 98.2°F | Ht 66.0 in | Wt 164.0 lb

## 2017-03-15 DIAGNOSIS — R1013 Epigastric pain: Secondary | ICD-10-CM

## 2017-03-15 NOTE — Patient Instructions (Signed)
Please go to the medical mall at this time to have your labs completed. I will call you with the results as soon as they are received.  Directions to Medical Mall: When leaving our office, go right. Go all of the way down to the very end of the hallway. You will have a purple wall in front of you. You will now have a tunnel to the hospital on your left hand side. Go through this tunnel and the elevators will be on your left. Go down to the 1st floor and take a slight left. The very first desk on the right hand side is the registration desk.  You may use Tylenol to help the pain and the pain should become increasingly better as the days go by. Your nausea should also resolve as the pain decreases.   If you have any questions or concerns at all, please call our office.

## 2017-03-15 NOTE — Progress Notes (Signed)
Surgical Clinic Progress/Follow-up Note   HPI:  31 y.o. Female presents to clinic for post-op follow-up evaluation 11 days s/p laparoscopic cholecystectomy for symptomatic cholelithiasis. Patient reports the gabapentin prescribed for her at her recent surgical follow-up after her oxycodone (prescribed at time of surgery) was accidentally thrown away and subsequently prescribed Norco made her feel "loopy and weird", reports residual focal peri-incisional epigastric abdominal pain without any abdominal pain otherwise and without any further N/V or any fever/chills, CP, or SOB. She also reports tolerating regular diet with normal +flatus and daily BM's.  Of note, patient also states she plans to embark on a >9128-month roadtrip to [place] in the next week.  Review of Systems:  Constitutional: denies any other weight loss, fever, chills, or sweats  Eyes: denies any other vision changes, history of eye injury  ENT: denies sore throat, hearing problems  Respiratory: denies shortness of breath, wheezing  Cardiovascular: denies chest pain, palpitations  Gastrointestinal: abdominal pain, N/V, and bowel function as per HPI Musculoskeletal: denies any other joint pains or cramps  Skin: Denies any other rashes or skin discolorations  Neurological: denies any other headache, dizziness, weakness  Psychiatric: denies any other depression, anxiety  All other review of systems: otherwise negative   Vital Signs:  BP 127/80   Pulse 98   Temp 98.2 F (36.8 C) (Oral)   Ht 5\' 6"  (1.676 m)   Wt 164 lb (74.4 kg)   BMI 26.47 kg/m    Physical Exam:  Constitutional:  -- Normal body habitus  -- Awake, alert, and oriented x3  Eyes:  -- Pupils equally round and reactive to light  -- No scleral icterus  Ear, nose, throat:  -- No jugular venous distension  -- No nasal drainage, bleeding Pulmonary:  -- No crackles  -- No dullness to percussion  Cardiovascular:  -- S1, S2 present  -- No pericardial  rubs  Gastrointestinal:  -- Soft and nondistended with only mild-/moderate- very focal peri-incisional epigastric abdominal tenderness to palpation, no guarding/rebound -- All incisions appear well-approximated and healing well without any surrounding erythema or drainage -- No abdominal masses appreciated, pulsatile or otherwise  Musculoskeletal / Integumentary:  -- Wounds or skin discoloration: None except post-surgical wounds as above (see GI)  -- Extremities: B/L UE and LE FROM, hands and feet warm, no edema  Neurologic:  -- Motor function: intact and symmetric  -- Sensation: intact and symmetric   Assessment:  31 y.o. yo Female with a problem list including...  Patient Active Problem List   Diagnosis Date Noted  . Symptomatic cholelithiasis 02/18/2017  . Intractable vomiting with nausea   . Bariatric surgery status   . GERD (gastroesophageal reflux disease) 01/20/2017  . Poor concentration 01/14/2017  . Rash and nonspecific skin eruption 10/01/2016  . Midline low back pain without sciatica 10/01/2016  . Palpitations 08/24/2016  . Bipolar affective (HCC) 08/12/2016  . Encounter to establish care 08/12/2016  . Sleep apnea 10/10/2013    presents to clinic for post-op follow-up evaluation, doing overall well s/p laparoscopic cholecystectomy for symptomatic cholelithiasis.  Plan:   - check CBC and CMP  - if labs WNL (as expected), okay for patient to travel, but do not drive if pain  - Tylenol prn (as per package instructions) for mild residual epigastric peri-incisional pain  - okay to advance diet as tolerated, submerge incisions under water (baths/swimming) prn, and resume activities prn  - return to clinic as needed upon return from travel, instructed to call office if  any questions or concerns  All of the above recommendations were discussed with the patient, and all of patient's questions were answered to her expressed satisfaction.  -- Scherrie Gerlach Earlene Plater, MD, RPVI [facility]: [facility] General [facility] - Partnering for exceptional care. Office: [phone]

## 2017-03-16 ENCOUNTER — Encounter: Payer: Self-pay | Admitting: Family

## 2017-04-02 ENCOUNTER — Other Ambulatory Visit: Payer: Self-pay | Admitting: Psychiatry

## 2017-05-25 ENCOUNTER — Other Ambulatory Visit: Payer: Self-pay | Admitting: Family

## 2017-05-27 ENCOUNTER — Telehealth: Payer: Self-pay

## 2017-05-27 NOTE — Telephone Encounter (Signed)
Ok, please call in one month supply of meds to her old pharmacy

## 2017-05-27 NOTE — Telephone Encounter (Signed)
pt called states she moved to [place]. she needs at least a month supply of her medication to do until she get in with a doctor in [place].  pt states she needs buspar, cymbalta, traszodone,

## 2017-06-21 ENCOUNTER — Other Ambulatory Visit: Payer: Self-pay | Admitting: Family

## 2017-06-21 MED ORDER — METOPROLOL SUCCINATE ER 25 MG PO TB24: 25.0000 mg | ORAL_TABLET | Freq: Two times a day (BID) | ORAL | 0 refills | Status: DC

## 2017-07-19 ENCOUNTER — Other Ambulatory Visit: Payer: Self-pay | Admitting: Family

## 2017-07-19 MED ORDER — METOPROLOL SUCCINATE ER 25 MG PO TB24: 25.0000 mg | ORAL_TABLET | Freq: Two times a day (BID) | ORAL | 0 refills | Status: AC

## 2018-10-09 IMAGING — US US ABDOMEN LIMITED
1 series · 14 of 25 positions shown · non-contrast
Comparison: None.

CLINICAL DATA: RIGHT upper quadrant pain, nausea for 4 days

EXAM:
US ABDOMEN LIMITED - RIGHT UPPER QUADRANT

[Series 1: us abdomen limited · 0.20mm/px · 14 of 49 slices shown]
[im 1/49]
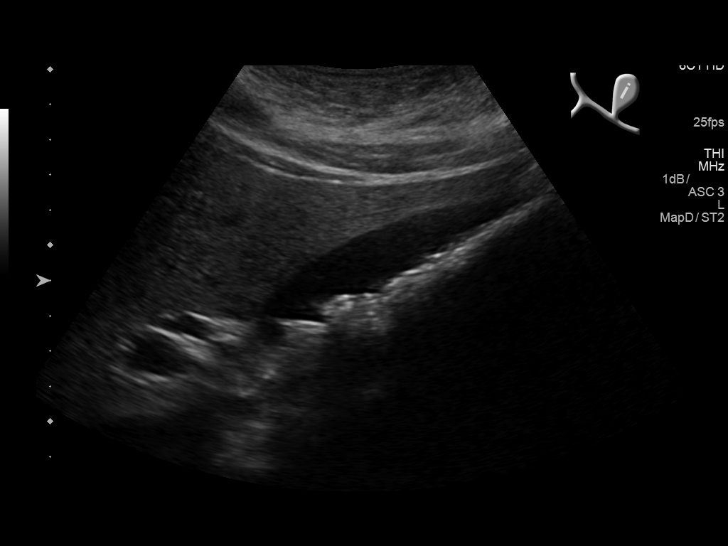
[im 5/49]
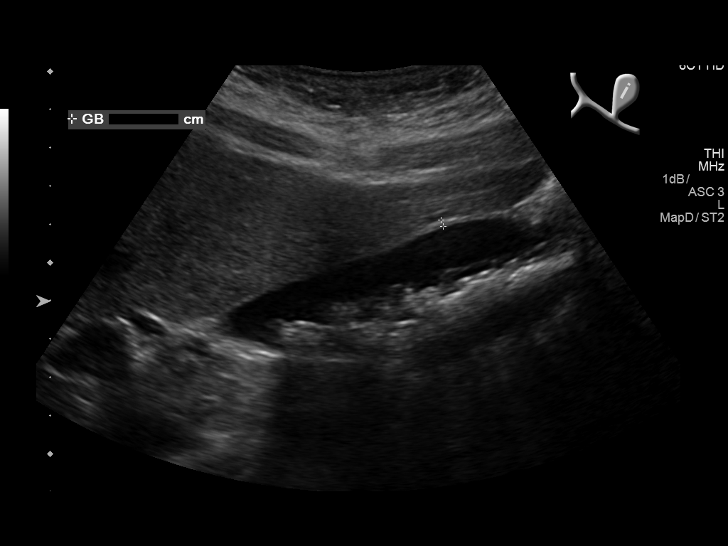
[im 9/49]
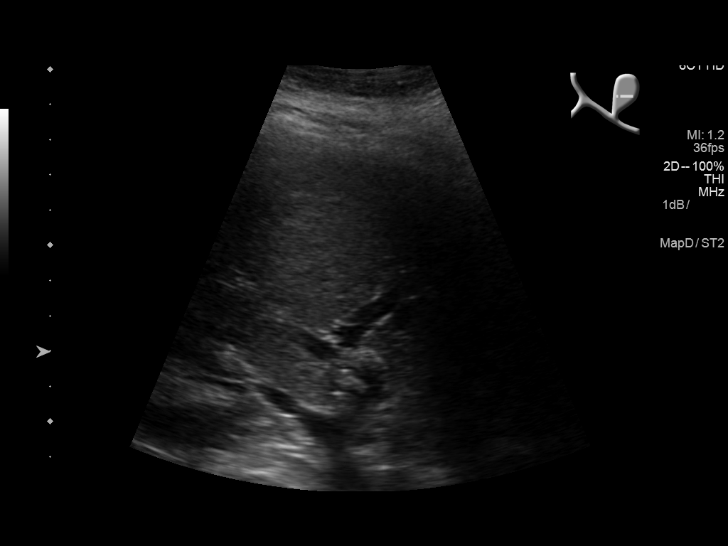
[im 13/49]
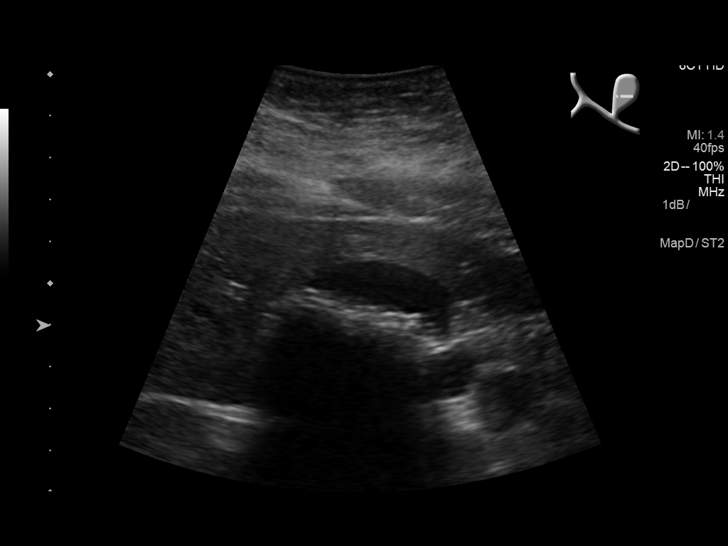
[im 17/49]
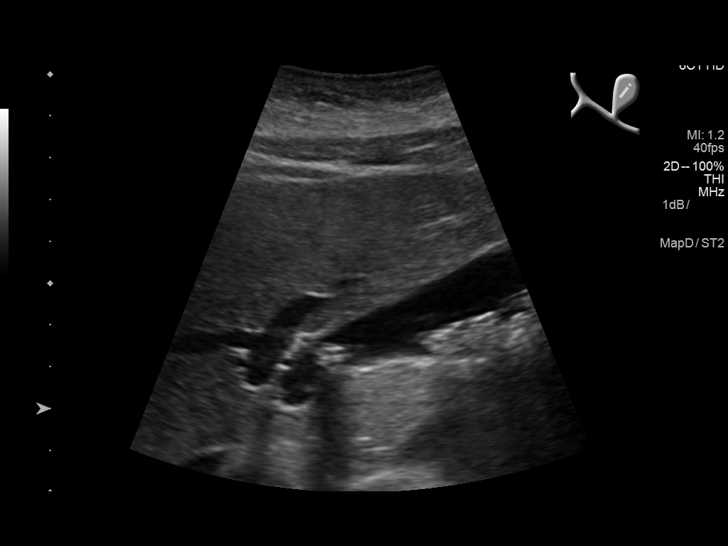
[im 19/49]
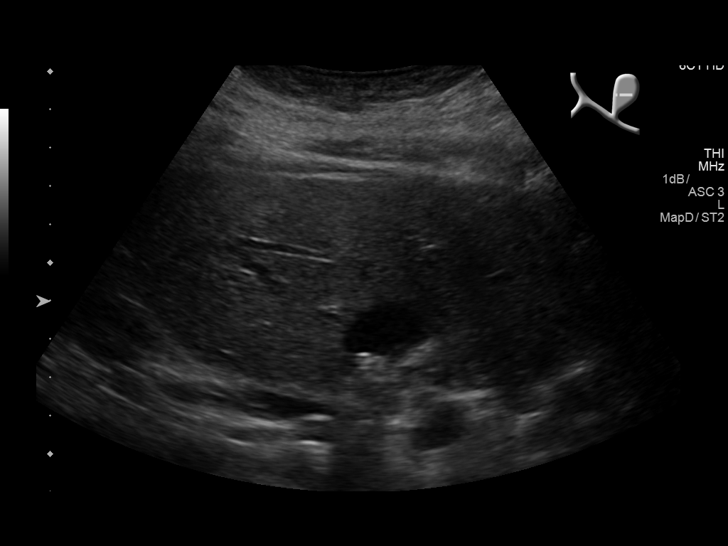
[im 23/49]
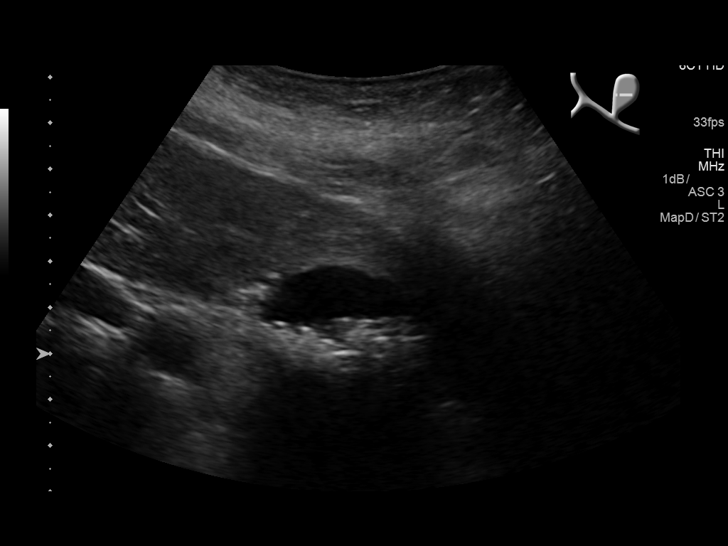
[im 27/49]
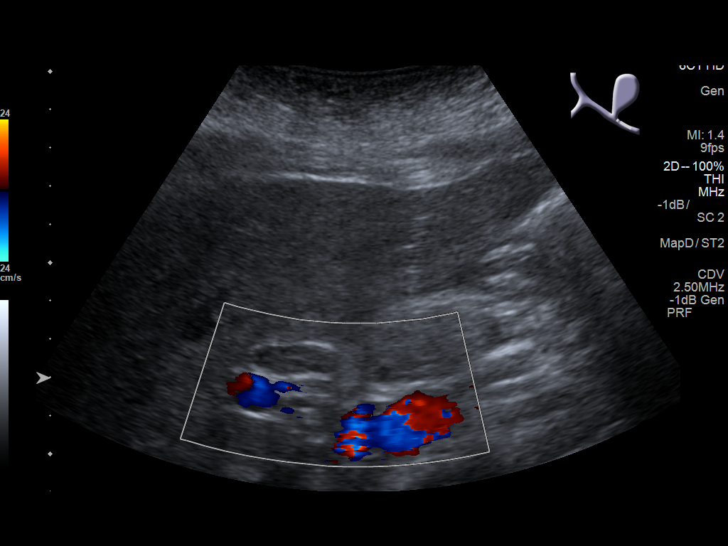
[im 31/49]
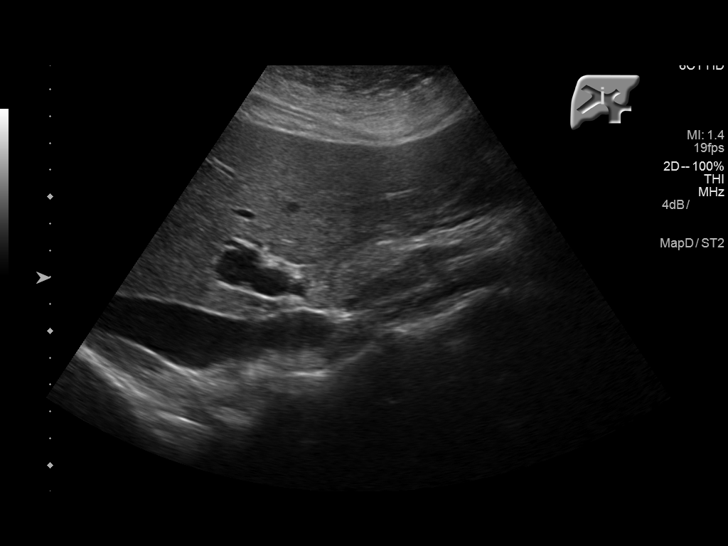
[im 33/49]
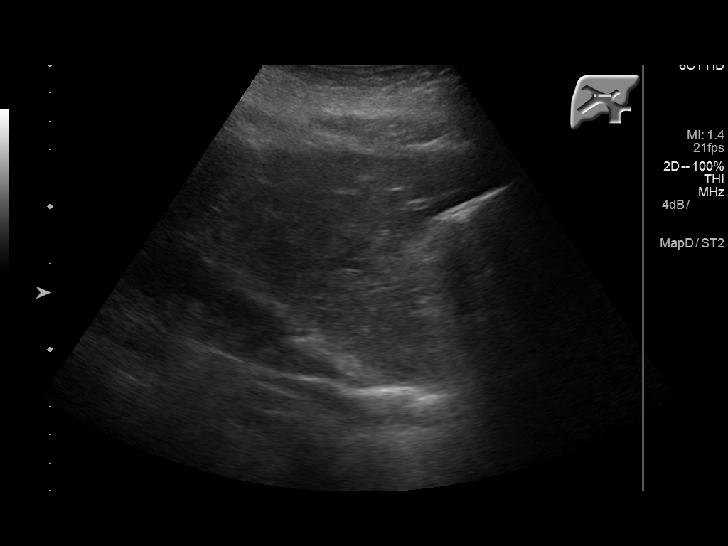
[im 37/49]
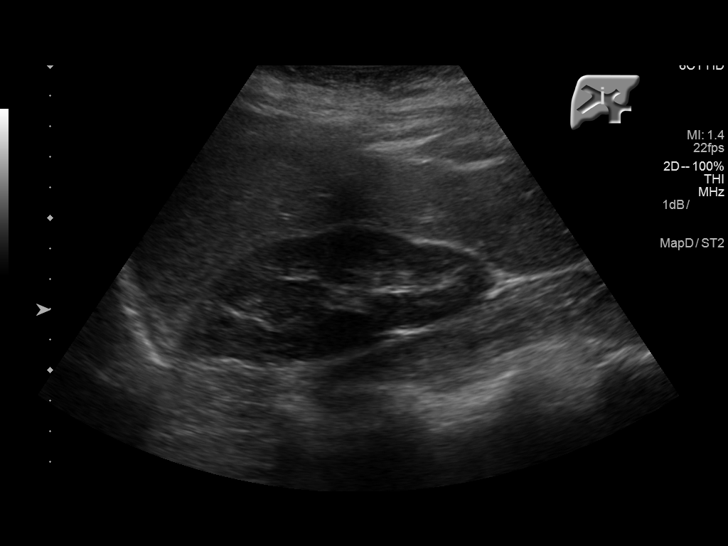
[im 41/49]
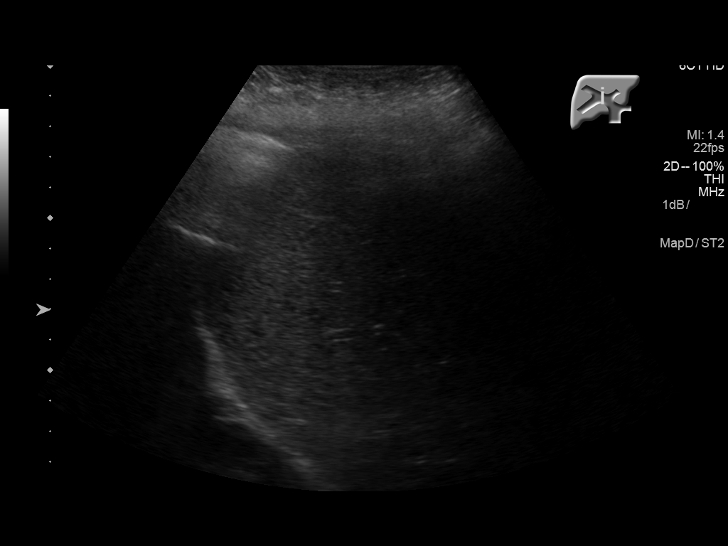
[im 45/49]
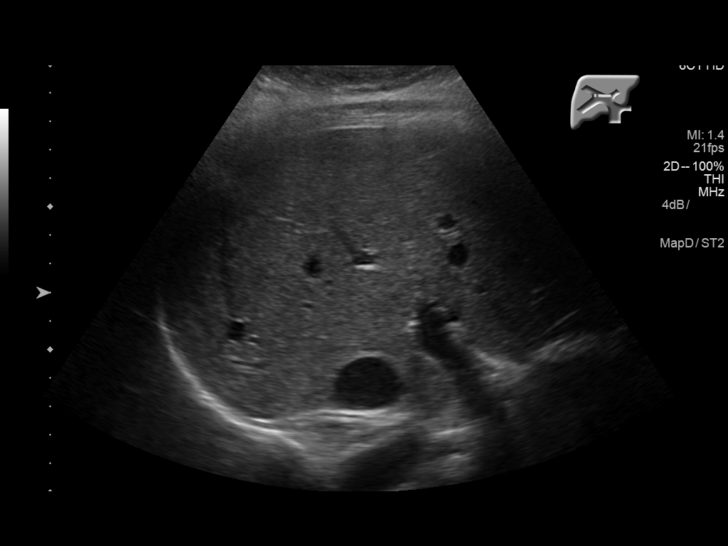
[im 49/49]
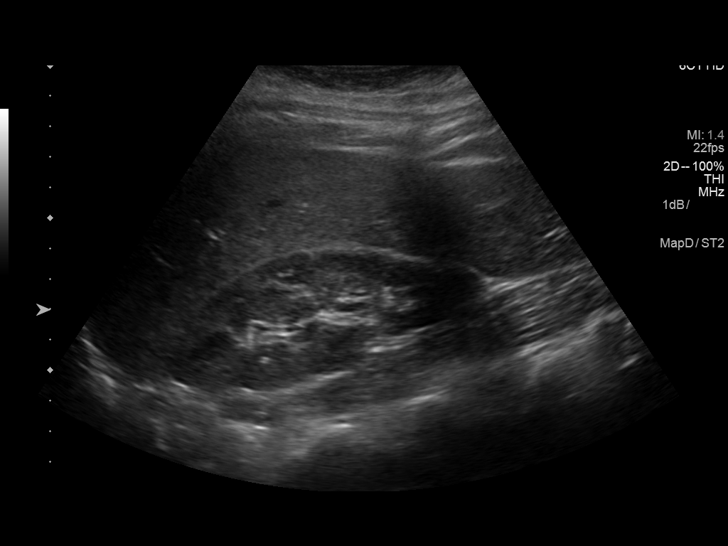

[14 of 25 positions shown; findings below may reference images not displayed]

FINDINGS: Gallbladder:

Gallbladder is filled with multiple shadowing gallstones measuring
approximately 810 mm each. There approximately 12 gallstones. No
gallbladder wall thickening or pericholecystic fluid. Negative
sonographic Linette side.

Common bile duct:

Diameter: Minimally dilated at 7 to 8 mm.

Liver:

No focal lesion identified. Within normal limits in parenchymal
echogenicity.
IMPRESSION: 1. Multiple gallstones without evidence acute cholecystitis.
2. Dilatation of the common bile duct.
3. Recommend correlation with serum bilirubin levels.

## 2018-11-13 IMAGING — US US ABDOMEN LIMITED
1 series · 14 of 25 positions shown · non-contrast
Comparison: 02/06/2017 MRI abdomen. 01/18/2017 right upper quadrant
abdominal sonogram.

CLINICAL DATA: Right upper quadrant abdominal pain for 1 day

EXAM:
US ABDOMEN LIMITED - RIGHT UPPER QUADRANT

[Series 1: us abdomen limited · 0.19mm/px · 14 of 42 slices shown]
[im 1/42]
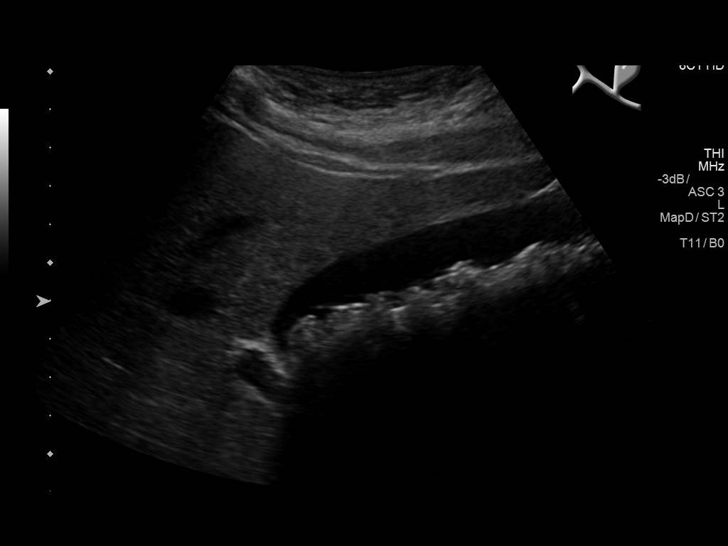
[im 4/42]
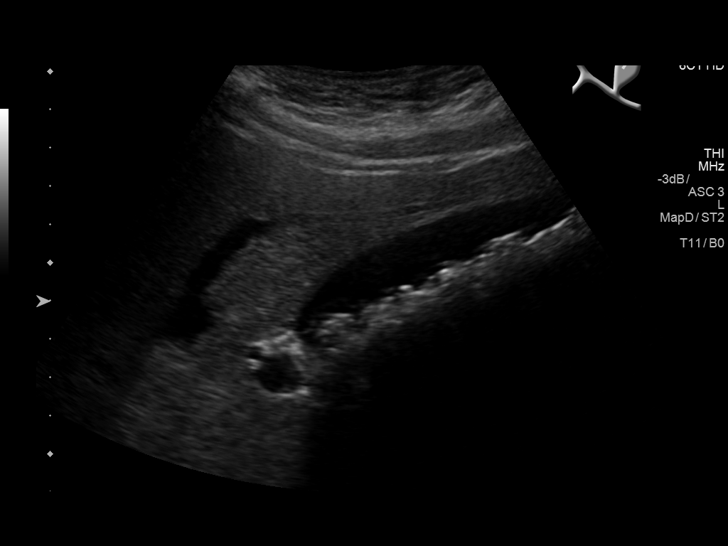
[im 7/42]
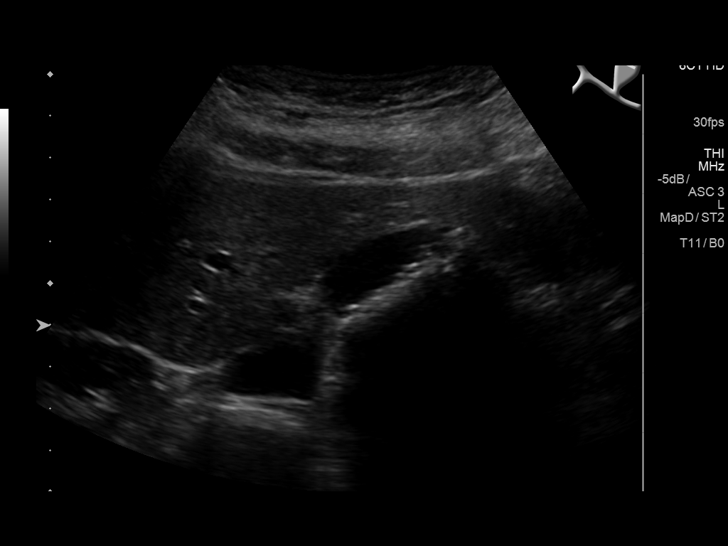
[im 11/42]
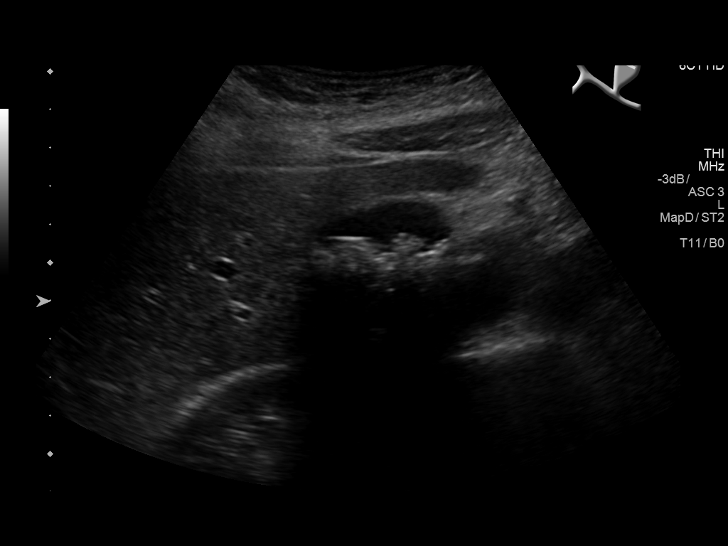
[im 14/42]
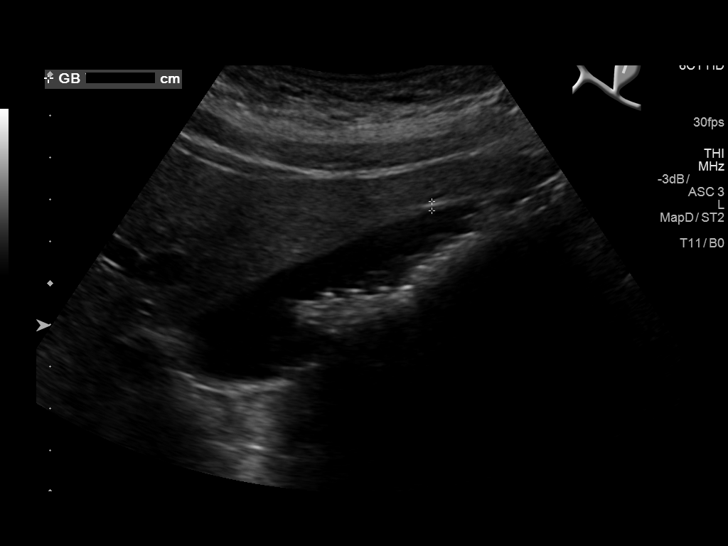
[im 16/42]
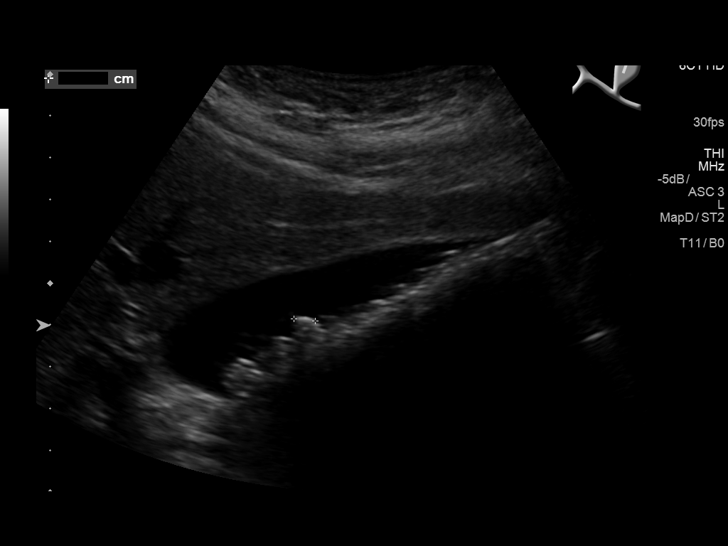
[im 19/42]
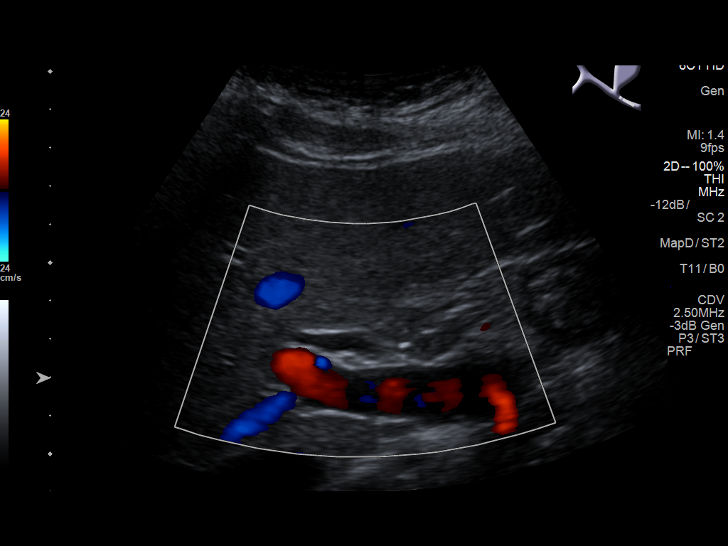
[im 23/42]
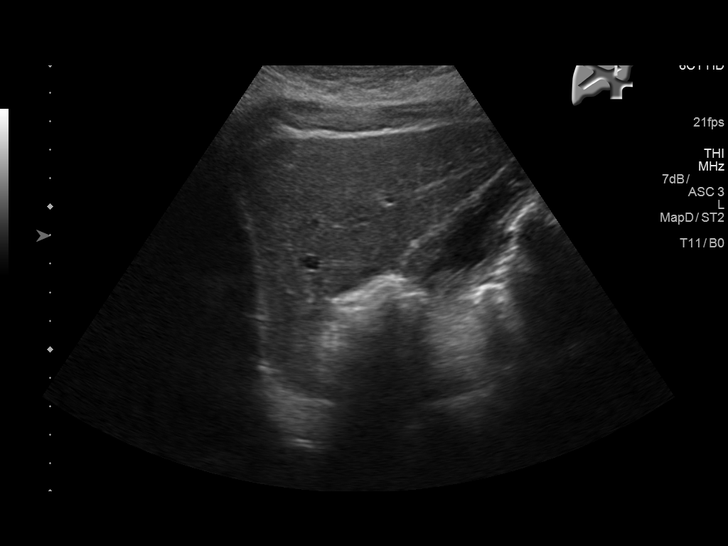
[im 26/42]
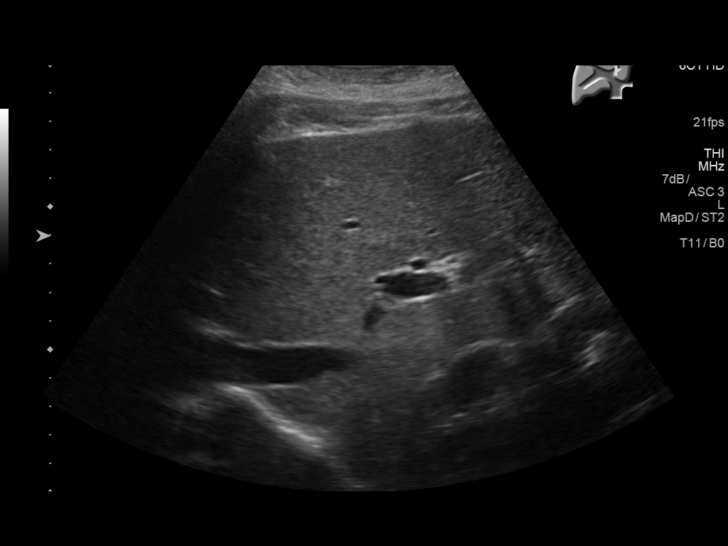
[im 28/42]
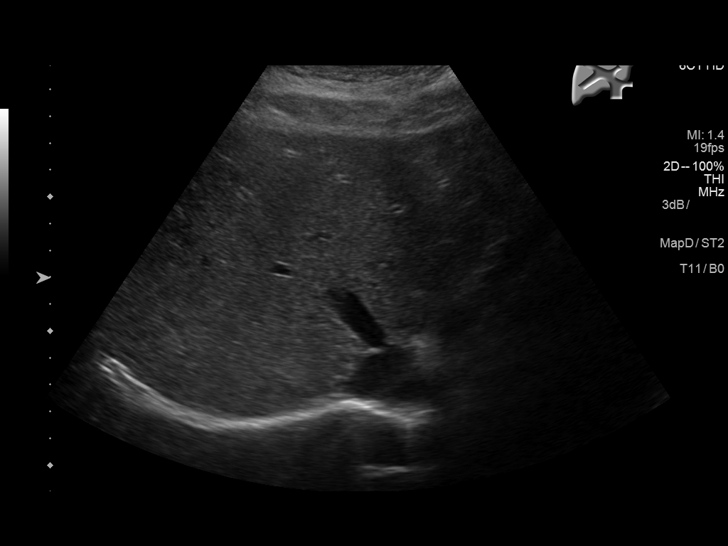
[im 31/42]
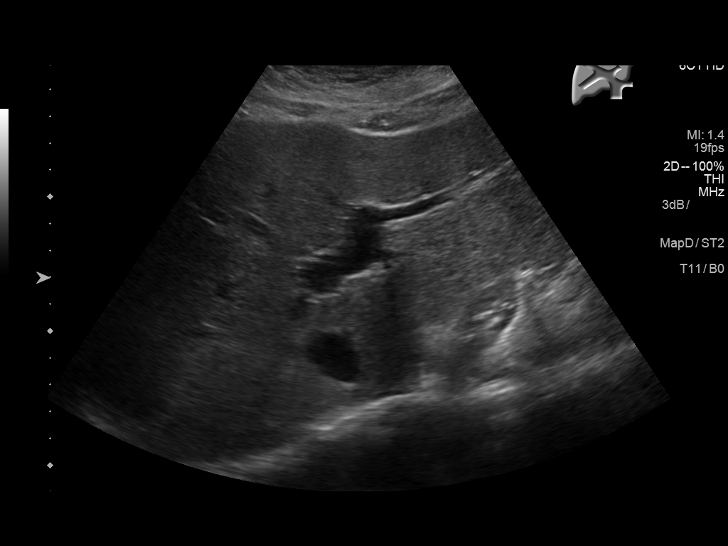
[im 35/42]
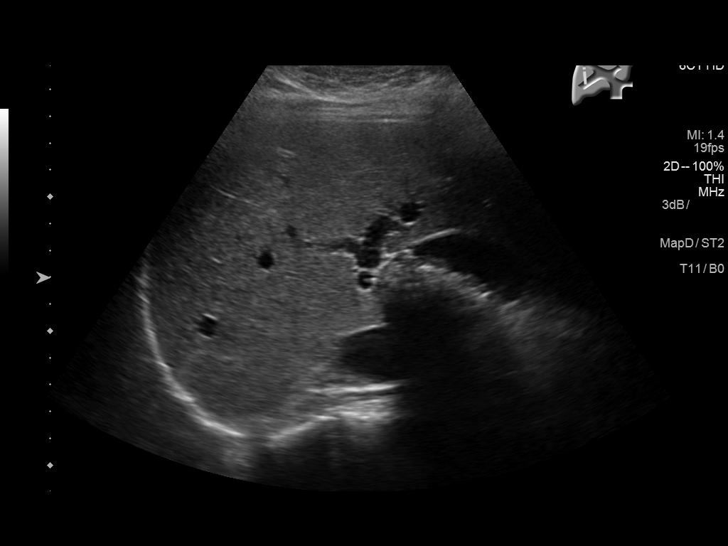
[im 38/42]
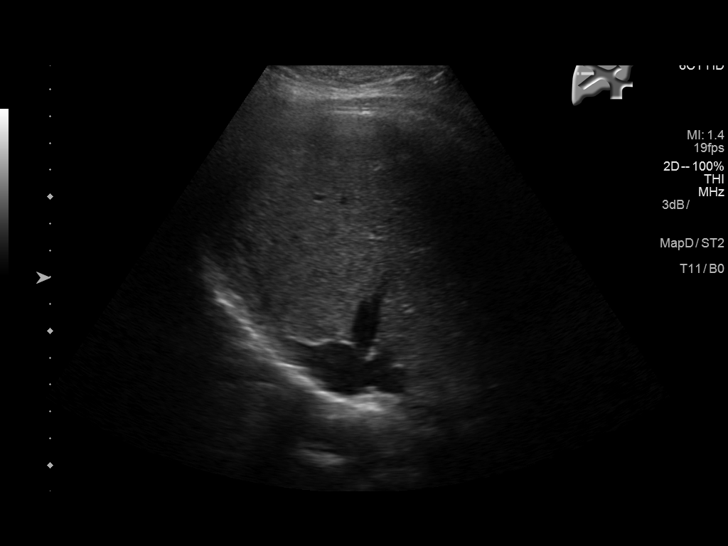
[im 42/42]
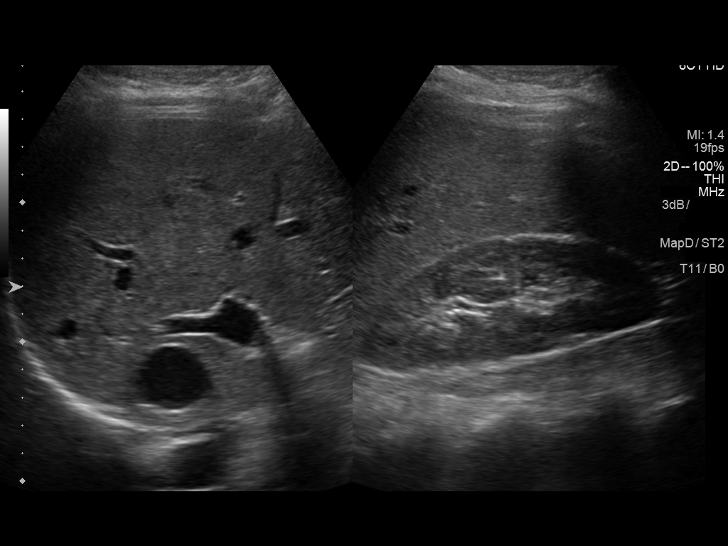

[14 of 25 positions shown; findings below may reference images not displayed]

FINDINGS: Gallbladder:

Numerous layering shadowing calcified subcentimeter gallstones
filling approximately [DATE] of the gallbladder, 1 of which appears
lodged in the gallbladder neck. No gallbladder wall thickening or
pericholecystic fluid. The technologist reports a sonographic Murphy
sign is present.

Common bile duct:

Diameter: 3 mm

Liver:

No focal lesion identified. Within normal limits in parenchymal
echogenicity.
IMPRESSION: 1. Extensive cholelithiasis with sonographic Murphy's sign,
compatible with acute calculous cholecystitis in the correct
clinical setting.
2. No biliary ductal dilatation.
3. Normal liver.

## 2019-02-14 ENCOUNTER — Telehealth: Payer: Self-pay

## 2019-02-14 NOTE — Telephone Encounter (Signed)
Called patient from recall list.  No answer. LMOV.  This is the 2nd attempt per recall list.   

## 2019-07-10 ENCOUNTER — Ambulatory Visit: Payer: Self-pay | Admitting: Cardiology

## 2019-08-31 ENCOUNTER — Telehealth: Payer: Self-pay | Admitting: Family

## 2019-08-31 ENCOUNTER — Ambulatory Visit: Payer: Medicaid Other | Admitting: Family Medicine

## 2019-08-31 ENCOUNTER — Other Ambulatory Visit: Payer: Self-pay

## 2019-08-31 ENCOUNTER — Encounter: Payer: Self-pay | Admitting: Family Medicine

## 2019-08-31 VITALS — BP 107/73 | HR 84 | Temp 98.6°F | Ht 64.5 in | Wt 158.0 lb

## 2019-08-31 DIAGNOSIS — Z3049 Encounter for surveillance of other contraceptives: Secondary | ICD-10-CM

## 2019-08-31 DIAGNOSIS — F419 Anxiety disorder, unspecified: Secondary | ICD-10-CM

## 2019-08-31 DIAGNOSIS — M5431 Sciatica, right side: Secondary | ICD-10-CM | POA: Diagnosis not present

## 2019-08-31 DIAGNOSIS — I471 Supraventricular tachycardia: Secondary | ICD-10-CM

## 2019-08-31 DIAGNOSIS — Z23 Encounter for immunization: Secondary | ICD-10-CM

## 2019-08-31 DIAGNOSIS — Z7689 Persons encountering health services in other specified circumstances: Secondary | ICD-10-CM

## 2019-08-31 DIAGNOSIS — F329 Major depressive disorder, single episode, unspecified: Secondary | ICD-10-CM

## 2019-08-31 DIAGNOSIS — F902 Attention-deficit hyperactivity disorder, combined type: Secondary | ICD-10-CM

## 2019-08-31 DIAGNOSIS — Z9884 Bariatric surgery status: Secondary | ICD-10-CM

## 2019-08-31 DIAGNOSIS — F32A Depression, unspecified: Secondary | ICD-10-CM

## 2019-08-31 MED ORDER — ATOMOXETINE HCL 60 MG PO CAPS: 60.0000 mg | ORAL_CAPSULE | Freq: Every day | ORAL | 0 refills | Status: DC

## 2019-08-31 MED ORDER — BACLOFEN 20 MG PO TABS: 20.0000 mg | ORAL_TABLET | Freq: Three times a day (TID) | ORAL | 1 refills | Status: DC

## 2019-08-31 NOTE — Progress Notes (Signed)
BP 107/73   Pulse 84   Temp 98.6 F (37 C) (Oral)   Ht 5' 4.5" (1.638 m)   Wt 158 lb (71.7 kg)   SpO2 98%   BMI 26.70 kg/m    Subjective:    Patient ID: Madeline White, female    DOB: 1986/08/06, 33 y.o.   MRN: [id number]  HPI: Madeline White is a 33 y.o. female  Chief Complaint  Patient presents with  . Establish Care  . Medication Refill    adderall   Patient presenting today to establish care.   Has been off her adderall for several weeks now for her ADHD and feels like she's been all over the place since being off. Tried intuniv in the past which didn't help, tried one month on strattera in the past which she would be willing to try again.   SVT - followed by Cardiology, on metoprolol which controls HR and palpitations successfully.   Also hx of depression, anxiety and possibly bipolar disorder based on chart review, appears she has seen numerous specialists for this over the years and tried many medications. Has tried zoloft, prozac, cymbalta, abilify, seroquel, latuda, and trazodone in the past.  States she has ativan that she takes rarely prn - has only taken a few tablets since summer when her mom passed away. Does deal with agitation and feels her focus suffers but otherwise doing well and not interested in trying new medications or counseling. Denies SI/HI.   Taking baclofen several times daily for right sciatica for over a year. Started up after having her two children. States she can't even get out of the bed if she does not take it.   Flexeril is too sedating. Has tried physical therapy for 12 weeks in the past and has tried injections and tens unit. Has also tried chiropractor for a while. Has had x-rays done but no MRI. Denies bowel or bladder incontinence, fevers, saddle paresthesias.   Hx of gastric bypass. Has been able to maintain her weight loss fairly well. Avoids NSAIDs and other gastric irritants.   Has IUD in place, will establish with GYN for  maintenance.   Depression screen [facility] 2/9 08/31/2019 08/12/2016  Decreased Interest 2 0  Down, Depressed, Hopeless 1 0  PHQ - 2 Score 3 0  Altered sleeping 1 -  Tired, decreased energy 0 -  Change in appetite 0 -  Feeling bad or failure about yourself  0 -  Trouble concentrating 3 -  Moving slowly or fidgety/restless 0 -  Suicidal thoughts 0 -  PHQ-9 Score 7 -   GAD 7 : Generalized Anxiety Score 08/31/2019  Nervous, Anxious, on Edge 2  Control/stop worrying 2  Worry too much - different things 2  Trouble relaxing 3  Restless 3  Easily annoyed or irritable 2  Afraid - awful might happen 0  Total GAD 7 Score 14  Anxiety Difficulty Very difficult     Relevant past medical, surgical, family and social history reviewed and updated as indicated. Interim medical history since our last visit reviewed. Allergies and medications reviewed and updated.  Review of Systems  Per HPI unless specifically indicated above     Objective:    BP 107/73   Pulse 84   Temp 98.6 F (37 C) (Oral)   Ht 5' 4.5" (1.638 m)   Wt 158 lb (71.7 kg)   SpO2 98%   BMI 26.70 kg/m   Wt Readings from Last 3 Encounters:  08/31/19 158  lb (71.7 kg)  03/15/17 164 lb (74.4 kg)  03/09/17 162 lb (73.5 kg)    Physical Exam Vitals and nursing note reviewed.  Constitutional:      Appearance: Normal appearance. She is not ill-appearing.  HENT:     Head: Atraumatic.  Eyes:     Extraocular Movements: Extraocular movements intact.     Conjunctiva/sclera: Conjunctivae normal.  Cardiovascular:     Rate and Rhythm: Normal rate and regular rhythm.     Heart sounds: Normal heart sounds.  Pulmonary:     Effort: Pulmonary effort is normal.     Breath sounds: Normal breath sounds.  Musculoskeletal:        General: No tenderness. Normal range of motion.     Cervical back: Normal range of motion and neck supple.  Skin:    General: Skin is warm and dry.  Neurological:     Mental Status: She is alert and  oriented to person, place, and time.  Psychiatric:        Mood and Affect: Mood normal.        Thought Content: Thought content normal.        Judgment: Judgment normal.     Results for orders placed or performed during the hospital encounter of 03/04/17  Pregnancy, urine POC  Result Value Ref Range   Preg Test, Ur NEGATIVE NEGATIVE  Surgical pathology  Result Value Ref Range   SURGICAL PATHOLOGY      Surgical Pathology CASE: [id number] PATIENT: Madeline Salk Surgical Pathology Report     SPECIMEN SUBMITTED: A. Gallbladder  CLINICAL HISTORY: None provided  PRE-OPERATIVE DIAGNOSIS: Cholelithiasis  POST-OPERATIVE DIAGNOSIS: Same as pre-op     DIAGNOSIS: A. GALLBLADDER; CHOLECYSTECTOMY: - CHRONIC CHOLECYSTITIS WITH CHOLELITHIASIS. - NEGATIVE FOR MALIGNANCY.   GROSS DESCRIPTION:  A. Labeled: gallbladder  Size of specimen: 10.0 x 4.2 x 3.7 cm  Previously opened: no  External surface: smooth blue green  Wall thickness: 0.2 cm  Mucosa: velvety green with focal yellow stippling  Stones present: yes, multiple yellow, aggregate 7.2 x 7.0 x 2.2 cm  Other findings: cystic duct margin inked blue  Block summary: 1 - representative section and en face cystic duct margin    Final Diagnosis performed by Elijah Birk, MD.  Electronically signed 03/08/2017 9:27:27AM    The electronic signature indicates that the named Attending Pathologist  has evaluated the specimen  Technical component performed at [facility], [address], [place], [place] [address] Lab: [phone] Dir: Titus Dubin. Cato Mulligan, MD  Professional component performed at [facility], [address] [place], [place], [place] [address] Lab: [phone] Dir: Georgiann Cocker. Oneita Kras, MD        Assessment & Plan:   Problem List Items Addressed This Visit      Cardiovascular and Mediastinum   SVT (supraventricular tachycardia) (HCC)    Followed by Cardiology, stable on  metoprolol. Continue current regimen        Other   Encounter to establish care   ADD (attention deficit disorder)    Diagnosed through previous PCP. Not a good candidate for stimulant therapy due to SVT. Will trial strattera and monitor for benefit      Bariatric surgery status    Hx of gastric bypass      Anxiety and depression    With failure of numerous medications. Declines referral to Psychiatry or trying new medications. Patient brought her ativan bottle in, appears she has many left, nearly her entire 90 tab script. Discussed to continue  very rare use of this as she already has it but will need to re-evaluate if needing refill.       Relevant Medications   LORazepam (ATIVAN) 1 MG tablet    Other Visit Diagnoses    Right sided sciatica    -  Primary   On significant amounts of baclofen daily. Will cont for now but referral placed to Neurosurgery for further evaluation and mgmt options   Relevant Medications   amphetamine-dextroamphetamine (ADDERALL XR) 30 MG 24 hr capsule   LORazepam (ATIVAN) 1 MG tablet   atomoxetine (STRATTERA) 60 MG capsule   baclofen (LIORESAL) 20 MG tablet   Other Relevant Orders   Ambulatory referral to Neurosurgery   Flu vaccine need       Relevant Orders   Flu Vaccine QUAD 6+ mos PF IM (Fluarix Quad PF) (Completed)   Encounter for surveillance of other contraceptive       WIll establish with GYN for IUD management       Follow up plan: Return in about 4 weeks (around 09/28/2019) for Focus .

## 2019-08-31 NOTE — Telephone Encounter (Signed)
As discussed at her OV, I do not feel that she is a good candidate for stimulant therapy. We had agreed upon strattera and that is what I am comfortable with at this time.

## 2019-08-31 NOTE — Telephone Encounter (Signed)
Paitent is calling back after her Office Visit. Patient checked with her previous PCP in [place]. Vyvanse worked very well with her. Patient did not know if Joycelyn Schmid could prescribe that medication? Please advise Preferred Pharmacy- [phone]

## 2019-09-01 ENCOUNTER — Encounter: Payer: Self-pay | Admitting: Family Medicine

## 2019-09-01 ENCOUNTER — Telehealth: Payer: Self-pay | Admitting: Family

## 2019-09-01 NOTE — Telephone Encounter (Signed)
Called and spoke to patient. She stated that she last picked up the Baclofen last week. I explained that the pharmacy would not fill the Baclofen again today because based on the last RX the patient should not be out or due for medication again yet. Patient verbalized understanding.

## 2019-09-01 NOTE — Telephone Encounter (Signed)
I'm not understanding...first it IS a different dose as stated in the message, from 10 mg to 20 mg and second she told me she was getting a week supply and I gave a 30 day supply. Can someone please find out what is going on from the pharmacy directly?

## 2019-09-01 NOTE — Telephone Encounter (Signed)
See my chart message, replied to patient there.

## 2019-09-01 NOTE — Telephone Encounter (Signed)
Pt is calling and  baclofen is written the same way as 10 mg. The rx was just  written for 20 mg  and pharm will not refill. Pt said the dosage or  the amount of medication  Needs to be charge. [facility]

## 2019-09-01 NOTE — Telephone Encounter (Signed)
Called and spoke to [facility]. Pharmacy tech stated that her provider in [place] was writing Baclofen 10 mg, take 1 to 2 tablets 2 to 3 times daily as needed. Apolonio Schneiders wrote for 20 mg tablets, take 3 times daily. Same exact amount per day as the RX from [place]. Pharmacy tech also mentioned that the patient has tried to fill this medication early several times. Will call patient and discuss.

## 2019-09-04 ENCOUNTER — Ambulatory Visit: Payer: Medicaid Other | Admitting: Cardiology

## 2019-09-05 ENCOUNTER — Ambulatory Visit: Payer: 59 | Admitting: Family Medicine

## 2019-09-06 NOTE — Assessment & Plan Note (Signed)
Diagnosed through previous PCP. Not a good candidate for stimulant therapy due to SVT. Will trial strattera and monitor for benefit

## 2019-09-06 NOTE — Assessment & Plan Note (Signed)
With failure of numerous medications. Declines referral to Psychiatry or trying new medications. Patient brought her ativan bottle in, appears she has many left, nearly her entire 90 tab script. Discussed to continue very rare use of this as she already has it but will need to re-evaluate if needing refill.

## 2019-09-06 NOTE — Assessment & Plan Note (Signed)
Followed by Cardiology, stable on metoprolol. Continue current regimen

## 2019-09-06 NOTE — Assessment & Plan Note (Signed)
Hx of gastric bypass

## 2019-09-28 ENCOUNTER — Ambulatory Visit (INDEPENDENT_AMBULATORY_CARE_PROVIDER_SITE_OTHER): Payer: Medicaid Other | Admitting: Family Medicine

## 2019-09-28 ENCOUNTER — Encounter: Payer: Self-pay | Admitting: Family Medicine

## 2019-09-28 ENCOUNTER — Other Ambulatory Visit: Payer: Self-pay

## 2019-09-28 VITALS — Temp 97.5°F | Ht 64.5 in | Wt 142.2 lb

## 2019-09-28 DIAGNOSIS — F902 Attention-deficit hyperactivity disorder, combined type: Secondary | ICD-10-CM

## 2019-09-28 NOTE — Progress Notes (Signed)
Temp (!) 97.5 F (36.4 C) (Temporal)   Ht 5' 4.5" (1.638 m)   Wt 142 lb 4 oz (64.5 kg)   BMI 24.04 kg/m    Subjective:    Patient ID: Madeline White, female    DOB: 10/23/1985, 34 y.o.   MRN: [id number]  HPI: Madeline White is a 34 y.o. female  Chief Complaint  Patient presents with  . ADHD    patient states that the strattera makes her very sleepy    . This visit was completed via WebEx due to the restrictions of the COVID-19 pandemic. All issues as above were discussed and addressed. Physical exam was done as above through visual confirmation on WebEx. If it was felt that the patient should be evaluated in the office, they were directed there. The patient verbally consented to this visit. . Location of the patient: home . Location of the provider: work . Those involved with this call:  . Provider: Roosvelt Maser, PA-C . CMA: Elton Sin, CMA . Front Desk/Registration: Harriet Pho  . Time spent on call: 15 minutes with patient face to face via video conference. More than 50% of this time was spent in counseling and coordination of care. 5 minutes total spent in review of patient's record and preparation of their chart. I verified patient identity using two factors (patient name and date of birth). Patient consents verbally to being seen via telemedicine visit today.   Patient presenting today for ADHD f/u after starting strattera. Feeling very sedated, no appetite, lethargic since starting it. Notes she now remembers she tried this with her Psychiatrist in [place] and had the same effect even at lower dose. Also had this issue with intuniv. Does not tolerate wellbutrin either. Takes B vitamins but that does not help, nor dose organization strategies. Struggling to help her son with virtual learning and having trouble staying on top of tasks at home. Used to be on low dose vyvanse and concerta which worked well. Adderall seemed to mess with her SVT too much so she was taken off that.    Relevant past medical, surgical, family and social history reviewed and updated as indicated. Interim medical history since our last visit reviewed. Allergies and medications reviewed and updated.  Review of Systems  Per HPI unless specifically indicated above     Objective:    Temp (!) 97.5 F (36.4 C) (Temporal)   Ht 5' 4.5" (1.638 m)   Wt 142 lb 4 oz (64.5 kg)   BMI 24.04 kg/m   Wt Readings from Last 3 Encounters:  09/28/19 142 lb 4 oz (64.5 kg)  08/31/19 158 lb (71.7 kg)  03/15/17 164 lb (74.4 kg)    Physical Exam Vitals and nursing note reviewed.  Constitutional:      General: She is not in acute distress.    Appearance: Normal appearance.  HENT:     Head: Atraumatic.     Right Ear: External ear normal.     Left Ear: External ear normal.     Nose: Nose normal. No congestion.     Mouth/Throat:     Mouth: Mucous membranes are moist.     Pharynx: Oropharynx is clear. No posterior oropharyngeal erythema.  Eyes:     Extraocular Movements: Extraocular movements intact.     Conjunctiva/sclera: Conjunctivae normal.  Cardiovascular:     Comments: Unable to assess via virtual visit Pulmonary:     Effort: Pulmonary effort is normal. No respiratory distress.  Musculoskeletal:  General: Normal range of motion.     Cervical back: Normal range of motion.  Skin:    General: Skin is dry.     Findings: No erythema.  Neurological:     Mental Status: She is alert and oriented to person, place, and time.  Psychiatric:        Mood and Affect: Mood normal.        Thought Content: Thought content normal.        Judgment: Judgment normal.     Results for orders placed or performed in visit on 08/31/19  HM PAP SMEAR  Result Value Ref Range   HM Pap smear Negative       Assessment & Plan:   Problem List Items Addressed This Visit      Other   ADD (attention deficit disorder) - Primary    Has failed numerous non stimulant options, but given her SVT do not feel  comfortable giving her a stimulant medication. Will refer to Psychiatry for further management and recommendations. Patient very upset and concerned about how she's going to be helpful to her family like this and wanting immediate help with this issue. Will mark referral as urgent. Also discussed multiple clinics in town who provide walk in hours to see if this may serve her quicker for an evaluation.       Relevant Orders   Ambulatory referral to Psychiatry       Follow up plan: Return if symptoms worsen or fail to improve.

## 2019-09-28 NOTE — Assessment & Plan Note (Signed)
Has failed numerous non stimulant options, but given her SVT do not feel comfortable giving her a stimulant medication. Will refer to Psychiatry for further management and recommendations. Patient very upset and concerned about how she's going to be helpful to her family like this and wanting immediate help with this issue. Will mark referral as urgent. Also discussed multiple clinics in town who provide walk in hours to see if this may serve her quicker for an evaluation.

## 2019-10-05 ENCOUNTER — Other Ambulatory Visit: Payer: Self-pay | Admitting: Family Medicine

## 2019-10-05 NOTE — Telephone Encounter (Signed)
Routing to provider  

## 2019-10-05 NOTE — Telephone Encounter (Signed)
Requested medication (s) are due for refill today- no  Requested medication (s) are on the active medication list -yes  Future visit scheduled -no  Last refill: 09/07/19  Notes to clinic: request from pharmacy for refill orders for next Rx request of non delegated Rx. Sent for PCP review   Requested Prescriptions  Pending Prescriptions Disp Refills   baclofen (LIORESAL) 20 MG tablet [Pharmacy Med Name: BACLOFEN 20 MG TAB] 90 each 1    Sig: TAKE 1 TABLET BY MOUTH 3 TIMES DAILY      Not Delegated - Analgesics:  Muscle Relaxants Failed - 10/05/2019 12:00 PM      Failed - This refill cannot be delegated      Passed - Valid encounter within last 6 months    Recent Outpatient Visits           1 week ago Attention deficit hyperactivity disorder (ADHD), combined type   [facility] Particia Nearing, [place]   1 month ago Right sided sciatica   [facility] Particia Nearing, [place]                  Requested Prescriptions  Pending Prescriptions Disp Refills   baclofen (LIORESAL) 20 MG tablet [Pharmacy Med Name: BACLOFEN 20 MG TAB] 90 each 1    Sig: TAKE 1 TABLET BY MOUTH 3 TIMES DAILY      Not Delegated - Analgesics:  Muscle Relaxants Failed - 10/05/2019 12:00 PM      Failed - This refill cannot be delegated      Passed - Valid encounter within last 6 months    Recent Outpatient Visits           1 week ago Attention deficit hyperactivity disorder (ADHD), combined type   [facility] Particia Nearing, [place]   1 month ago Right sided sciatica   [facility] Roosvelt Maser [place], [place]

## 2019-10-25 ENCOUNTER — Other Ambulatory Visit: Payer: Self-pay | Admitting: Student

## 2019-10-25 DIAGNOSIS — G8929 Other chronic pain: Secondary | ICD-10-CM

## 2019-11-12 ENCOUNTER — Ambulatory Visit: Payer: Medicaid Other

## 2019-11-16 ENCOUNTER — Other Ambulatory Visit: Payer: Self-pay | Admitting: Family Medicine

## 2019-11-16 NOTE — Telephone Encounter (Signed)
Requested medication (s) are due for refill today: yes  Requested medication (s) are on the active medication list: yes  Last refill:  08/31/2019  Future visit scheduled: no  Notes to clinic:  med not delegated, muscles relaxants  failed  Requested Prescriptions  Pending Prescriptions Disp Refills   baclofen (LIORESAL) 20 MG tablet [Pharmacy Med Name: BACLOFEN 20 MG TAB] 90 each 1    Sig: TAKE 1 TABLET BY MOUTH 3 TIMES DAILY      Not Delegated - Analgesics:  Muscle Relaxants Failed - 11/16/2019  5:48 PM      Failed - This refill cannot be delegated      Passed - Valid encounter within last 6 months    Recent Outpatient Visits           1 month ago Attention deficit hyperactivity disorder (ADHD), combined type   [facility] Particia Nearing, [place]   2 months ago Right sided sciatica   [facility] Roosvelt Maser [place], [place]

## 2019-11-17 ENCOUNTER — Other Ambulatory Visit: Payer: Self-pay | Admitting: Family Medicine

## 2019-11-17 MED ORDER — BACLOFEN 20 MG PO TABS: 20.0000 mg | ORAL_TABLET | Freq: Three times a day (TID) | ORAL | 1 refills | Status: DC

## 2019-11-17 NOTE — Telephone Encounter (Signed)
LOV 09/28/19

## 2019-11-17 NOTE — Telephone Encounter (Signed)
Duplicate refill request.

## 2019-11-17 NOTE — Telephone Encounter (Signed)
Requested medication (s) are due for refill today: Yes  Requested medication (s) are on the active medication list: Yes  Last refill:  08/31/19  Future visit scheduled: No  Notes to clinic:  See request.    Requested Prescriptions  Pending Prescriptions Disp Refills   baclofen (LIORESAL) 20 MG tablet 90 each 1    Sig: Take 1 tablet (20 mg total) by mouth 3 (three) times daily.      Not Delegated - Analgesics:  Muscle Relaxants Failed - 11/17/2019  9:25 AM      Failed - This refill cannot be delegated      Passed - Valid encounter within last 6 months    Recent Outpatient Visits           1 month ago Attention deficit hyperactivity disorder (ADHD), combined type   [facility] Particia Nearing, [place]   2 months ago Right sided sciatica   [facility] Roosvelt Maser [place], [place]

## 2019-11-17 NOTE — Telephone Encounter (Signed)
Copied from CRM [id number]. Topic: Quick Communication - Rx Refill/Question >> Nov 17, 2019  9:02 AM Jaquita Rector A wrote: Medication: baclofen (LIORESAL) 20 MG tablet   Has the patient contacted their pharmacy? Yes.   (Agent: If no, request that the patient contact the pharmacy for the refill.) (Agent: If yes, when and what did the pharmacy advise?)  Preferred Pharmacy (with phone number or street name): [facility] - [place], [place] - [address]  Phone:  [phone] Fax:  [phone]     Agent: Please be advised that RX refills may take up to 3 business days. We ask that you follow-up with your pharmacy.

## 2019-11-17 NOTE — Telephone Encounter (Signed)
Patient last seen 09/28/19

## 2019-11-21 ENCOUNTER — Encounter: Payer: Self-pay | Admitting: Family Medicine

## 2019-11-21 ENCOUNTER — Ambulatory Visit (INDEPENDENT_AMBULATORY_CARE_PROVIDER_SITE_OTHER): Payer: Medicaid Other | Admitting: Family Medicine

## 2019-11-21 DIAGNOSIS — K0889 Other specified disorders of teeth and supporting structures: Secondary | ICD-10-CM

## 2019-11-21 MED ORDER — TRAMADOL HCL 50 MG PO TABS: 50.0000 mg | ORAL_TABLET | Freq: Three times a day (TID) | ORAL | 0 refills | Status: AC | PRN

## 2019-11-21 MED ORDER — CLINDAMYCIN HCL 150 MG PO CAPS: 150.0000 mg | ORAL_CAPSULE | Freq: Two times a day (BID) | ORAL | 0 refills | Status: AC

## 2019-11-21 NOTE — Progress Notes (Signed)
   There were no vitals taken for this visit.   Subjective:    Patient ID: Madeline White, female    DOB: 1985-11-06, 34 y.o.   MRN: [id number]  HPI: Madeline White is a 34 y.o. female  Chief Complaint  Patient presents with  . Dental Pain    . This visit was completed via telephone due to the restrictions of the COVID-19 pandemic. All issues as above were discussed and addressed. Physical exam was done as above through visual confirmation on telephone. If it was felt that the patient should be evaluated in the office, they were directed there. The patient verbally consented to this visit. . Location of the patient: home . Location of the provider: home . Those involved with this call:  . Provider: Roosvelt Maser, PA-C . CMA: Elton Sin, CMA . Front Desk/Registration: Harriet Pho  . Time spent on call: 15 minutes on the phone discussing health concerns. 5 minutes total spent in review of patient's record and preparation of their chart. I verified patient identity using two factors (patient name and date of birth). Patient consents verbally to being seen via telemedicine visit today.   Left wisdom tooth pain, radiating up side of face and into ear worsening the last 4-5 days. Has been taking tylenol with minimal relief. Denies fever, drainage from area. Called her dentist and was told it's likely a wisdom tooth infection and to call PCP to ask about abx and then schedule extraction.   Relevant past medical, surgical, family and social history reviewed and updated as indicated. Interim medical history since our last visit reviewed. Allergies and medications reviewed and updated.  Review of Systems  Per HPI unless specifically indicated above     Objective:    There were no vitals taken for this visit.  Wt Readings from Last 3 Encounters:  09/28/19 142 lb 4 oz (64.5 kg)  08/31/19 158 lb (71.7 kg)  03/15/17 164 lb (74.4 kg)    Physical Exam  Unable to perform PE due to  patient lack of access to video technology for today's visit  Results for orders placed or performed in visit on 08/31/19  HM PAP SMEAR  Result Value Ref Range   HM Pap smear Negative       Assessment & Plan:   Problem List Items Addressed This Visit    None    Visit Diagnoses    Pain, dental    -  Primary   Will tx with clindamycin in case abscess, salt water gargles, tramadol prn for pain. Precautions reviewed, f/u with dentist for further eval and tx       Follow up plan: Return if symptoms worsen or fail to improve.

## 2019-11-22 ENCOUNTER — Telehealth: Payer: Self-pay | Admitting: Family Medicine

## 2019-11-22 NOTE — Telephone Encounter (Signed)
Will d/c from medication list, can come in for toradol shot if desired since she can't take NSAIDs due to hx of bariatric surgery. All opioids are related to tramadol and will also cause itching so will avoid.

## 2019-11-22 NOTE — Telephone Encounter (Signed)
Patient notified. Patient stated she cannot take toradol shot.

## 2019-11-22 NOTE — Telephone Encounter (Signed)
Patient called in stating she had a reaction to traMADol (ULTRAM) 50 MG tablet. Pt states after she took it an hour later she started to itch. At this time pt has stopped taking medication and would like an alternative. Please advise.

## 2019-11-22 NOTE — Telephone Encounter (Signed)
Added to allergy list. Routing to provider to advise.

## 2019-11-22 NOTE — Telephone Encounter (Signed)
Well she should just continue tylenol then. IM toradol doesn't affect her GI tract like oral NSAIDs so unless she's reacted to it in the past she should be able to have it.

## 2019-12-21 ENCOUNTER — Ambulatory Visit: Payer: Medicaid Other | Attending: Internal Medicine

## 2019-12-21 DIAGNOSIS — Z23 Encounter for immunization: Secondary | ICD-10-CM

## 2019-12-21 NOTE — Progress Notes (Signed)
   Covid-19 Vaccination Clinic  Name:  Madeline White    MRN: [id number] DOB: 06-Jun-1986  12/21/2019  Ms. Games was observed post Covid-19 immunization for 15 minutes without incident. She was provided with Vaccine Information Sheet and instruction to access the V-Safe system.   [name] was instructed to call 911 with any severe reactions post vaccine: Marland Kitchen Difficulty breathing  . Swelling of face and throat  . A fast heartbeat  . A bad rash all over body  . Dizziness and weakness   Immunizations Administered    Name Date Dose VIS Date Route   Pfizer COVID-19 Vaccine 12/21/2019  9:59 AM 0.3 mL 09/01/2019 Intramuscular   Manufacturer: [employer], [employer]   Lot: [id number]   NDC: [address]-1

## 2019-12-28 ENCOUNTER — Telehealth: Payer: Medicaid Other | Admitting: Family Medicine

## 2020-01-01 ENCOUNTER — Ambulatory Visit (INDEPENDENT_AMBULATORY_CARE_PROVIDER_SITE_OTHER): Payer: Medicaid Other | Admitting: Cardiology

## 2020-01-01 ENCOUNTER — Encounter: Payer: Self-pay | Admitting: Cardiology

## 2020-01-01 ENCOUNTER — Other Ambulatory Visit: Payer: Self-pay

## 2020-01-01 VITALS — BP 98/80 | HR 91 | Ht 66.0 in | Wt 146.0 lb

## 2020-01-01 DIAGNOSIS — I471 Supraventricular tachycardia: Secondary | ICD-10-CM | POA: Diagnosis not present

## 2020-01-01 NOTE — Patient Instructions (Signed)
Medication Instructions:  Your physician recommends that you continue on your current medications as directed. Please refer to the Current Medication list given to you today.  *If you need a refill on your cardiac medications before your next appointment, please call your pharmacy*   Lab Work: None ordered If you have labs (blood work) drawn today and your tests are completely normal, you will receive your results only by: . MyChart Message (if you have MyChart) OR . A paper copy in the mail If you have any lab test that is abnormal or we need to change your treatment, we will call you to review the results.   Testing/Procedures: None ordered   Follow-Up: At [facility], you and your health needs are our priority.  As part of our continuing mission to provide you with exceptional heart care, we have created designated Provider Care Teams.  These Care Teams include your primary Cardiologist (physician) and Advanced Practice Providers (APPs -  Physician Assistants and Nurse Practitioners) who all work together to provide you with the care you need, when you need it.  We recommend signing up for the patient portal called "MyChart".  Sign up information is provided on this After Visit Summary.  MyChart is used to connect with patients for Virtual Visits (Telemedicine).  Patients are able to view lab/test results, encounter notes, upcoming appointments, etc.  Non-urgent messages can be sent to your provider as well.   To learn more about what you can do with MyChart, go to [email or web address].    Your next appointment:   12 month(s)  The format for your next appointment:   In Person  Provider:    You may see Dr. Agbor-Etang or one of the following Advanced Practice Providers on your designated Care Team:    Christopher Berge, NP  Ryan Dunn, PA-C  Jacquelyn Visser, PA-C    Other Instructions N/A  

## 2020-01-01 NOTE — Progress Notes (Signed)
Cardiology Office Note:    Date:  01/01/2020   ID:  Madeline White, DOB [birth year], MRN [id number]  PCP:  Particia Nearing, PA-C  Cardiologist:  Debbe Odea, MD  Electrophysiologist:  None   Referring MD: No ref. provider found   Chief Complaint  Patient presents with  . Other    Past due follow up. LS- 2018- [place]. meds reviewed verbally with patient.     History of Present Illness:    Madeline White is a 34 y.o. female with a hx of anxiety, SVT presents to reestablish care due to history of SVT.  She was diagnosed with SVT at age 18 after passing out.  She had a 48-hour Holter placed by Dr. Darrold Junker at the time which revealed SVT.  Did not get any procedure such as an ablation performed.  Started having palpitations gain in 2018 and was seen by Dr. Alvino Chapel.  Echocardiogram in 2018 showed normal EF with LVEF 50 to 55%.  Diastolic function was normal.  Toprol-XL 25 mg daily was started.  Holter monitor at the time did not reveal any SVT.  She was advised to follow-up in 6 months but lost to follow-up as she moved to [place].  Since then her Toprol was increased to 25 mg twice daily.  She has not had any symptoms of palpitations since.  Denies chest pain, shortness of breath, edema, syncope.  She feels well and has no concerns at this time.  Past Medical History:  Diagnosis Date  . ADHD   . Allergy   . Anxiety   . Arthritis    lower back, knees  . Bipolar affective (HCC) 08/12/2016  . Bipolar affective disorder (HCC)   . Cardiac arrhythmia due to congenital heart disease   . Degenerative disc disease, lumbar   . Depression   . Family history of adverse reaction to anesthesia    [place] - "aspirates every time"  . GERD (gastroesophageal reflux disease)   . Intractable vomiting with nausea   . Palpitations 08/24/2016  . Poor concentration 01/14/2017  . Rash and nonspecific skin eruption 10/01/2016  . Sleep apnea 10/10/2013  . SVT (supraventricular  tachycardia) (HCC) 2006   had been on metoprolol 10mg  but stopped taking    Past Surgical History:  Procedure Laterality Date  . CESAREAN SECTION     x2  . CHOLECYSTECTOMY N/A 03/04/2017   Procedure: LAPAROSCOPIC CHOLECYSTECTOMY;  Surgeon: 03/06/2017, MD;  Location: [facility];  Service: General;  Laterality: N/A;  . ESOPHAGOGASTRODUODENOSCOPY (EGD) WITH PROPOFOL N/A 01/28/2017   Procedure: ESOPHAGOGASTRODUODENOSCOPY (EGD) WITH PROPOFOL;  Surgeon: 03/30/2017, MD;  Location: [facility];  Service: Endoscopy;  Laterality: N/A;  . GASTRIC BYPASS      Current Medications: Current Meds  Medication Sig  . baclofen (LIORESAL) 20 MG tablet Take 1 tablet (20 mg total) by mouth 3 (three) times daily.  . Biotin 1000 MCG tablet Take 1,000 mcg by mouth daily.   . cholecalciferol (VITAMIN D) 1000 units tablet Take 2,000 Units by mouth daily.  . clindamycin (CLEOCIN) 150 MG capsule Take 1 capsule (150 mg total) by mouth in the morning and at bedtime.  [facility] levonorgestrel (MIRENA) 20 MCG/24HR IUD 1 each by Intrauterine route once.  Marland Kitchen LORazepam (ATIVAN) 1 MG tablet   . metoprolol succinate (TOPROL-XL) 25 MG 24 hr tablet Take 1 tablet (25 mg total) by mouth 2 (two) times daily.     Allergies:   Tramadol and Penicillins   Social History  Socioeconomic History  . Marital status: Married    Spouse name: Not on file  . Number of children: Not on file  . Years of education: Not on file  . Highest education level: Not on file  Occupational History  . Not on file  Tobacco Use  . Smoking status: Never Smoker  . Smokeless tobacco: Never Used  Substance and Sexual Activity  . Alcohol use: No  . Drug use: No  . Sexual activity: Yes    Birth control/protection: I.U.D.  Other Topics Concern  . Not on file  Social History Narrative   From [place] and grew up here .      Married.      Just moved to from [place].       2 children, 88yrs- boy and 15 months- girl      Staying home.        Husband is Vet and has PTSD.       Caring for mother.       Had been a CMA at [employer].    Social Determinants of Health   Financial Resource Strain:   . Difficulty of Paying Living Expenses:   Food Insecurity:   . Worried About Programme researcher, broadcasting/film/video in the Last Year:   . Barista in the Last Year:   Transportation Needs:   . Freight forwarder (Medical):   Marland Kitchen Lack of Transportation (Non-Medical):   Physical Activity:   . Days of Exercise per Week:   . Minutes of Exercise per Session:   Stress:   . Feeling of Stress :   Social Connections:   . Frequency of Communication with Friends and Family:   . Frequency of Social Gatherings with Friends and Family:   . Attends Religious Services:   . Active Member of Clubs or Organizations:   . Attends Banker Meetings:   Marland Kitchen Marital Status:      Family History: The patient's family history includes Alcohol abuse in her paternal grandfather; Anxiety disorder in her son; Autism in her daughter; Bipolar disorder in her son; Cancer in her paternal grandmother; Congestive Heart Failure in her mother; Depression in her son; Diabetes in her father, maternal grandfather, maternal grandmother, mother, paternal grandfather, and paternal grandmother; Heart attack in her mother; Heart disease in her maternal grandfather, maternal grandmother, paternal grandfather, and paternal grandmother; Hyperlipidemia in her father and mother; Hypertension in her father and mother.  ROS:   Please see the history of present illness.     All other systems reviewed and are negative.  EKGs/Labs/Other Studies Reviewed:    The following studies were reviewed today:   EKG:  EKG is  ordered today.  The ekg ordered today demonstrates normal sinus rhythm, possible left atrial enlargement.  Recent Labs: No results found for requested labs within last 8760 hours.  Recent Lipid Panel No results found for: CHOL, TRIG, HDL, CHOLHDL, VLDL, LDLCALC,  LDLDIRECT  Physical Exam:    VS:  BP 98/80 (BP Location: Right Arm, Patient Position: Sitting, Cuff Size: Normal)   Pulse 91   Ht 5\' 6"  (1.676 m)   Wt 146 lb (66.2 kg)   SpO2 99%   BMI 23.57 kg/m     Wt Readings from Last 3 Encounters:  01/01/20 146 lb (66.2 kg)  09/28/19 142 lb 4 oz (64.5 kg)  08/31/19 158 lb (71.7 kg)     GEN:  Well nourished, well developed in no acute distress HEENT:  Normal NECK: No JVD; No carotid bruits LYMPHATICS: No lymphadenopathy CARDIAC: RRR, no murmurs, rubs, gallops RESPIRATORY:  Clear to auscultation without rales, wheezing or rhonchi  ABDOMEN: Soft, non-tender, non-distended MUSCULOSKELETAL:  No edema; No deformity  SKIN: Warm and dry NEUROLOGIC:  Alert and oriented x 3 PSYCHIATRIC:  Normal affect   ASSESSMENT:    1. SVT (supraventricular tachycardia) (HCC)    PLAN:    In order of problems listed above:  1. Patient with history of SVT.  Currently in sinus rhythm.  Denies palpitations or symptoms of syncope.  Continue Toprol XL 25 mg twice daily.  Last echocardiogram showed normal systolic and diastolic function.  Patient is clinically asymptomatic.  No indication for repeat echocardiogram.  Follow-up in 1 year.  This note was generated in part or whole with voice recognition software. Voice recognition is usually quite accurate but there are transcription errors that can and very often do occur. I apologize for any typographical errors that were not detected and corrected.  Medication Adjustments/Labs and Tests Ordered: Current medicines are reviewed at length with the patient today.  Concerns regarding medicines are outlined above.  Orders Placed This Encounter  Procedures  . EKG 12-Lead   No orders of the defined types were placed in this encounter.   Patient Instructions  Medication Instructions:  Your physician recommends that you continue on your current medications as directed. Please refer to the Current Medication list  given to you today.  *If you need a refill on your cardiac medications before your next appointment, please call your pharmacy*   Lab Work: None ordered If you have labs (blood work) drawn today and your tests are completely normal, you will receive your results only by: Marland Kitchen MyChart Message (if you have MyChart) OR . A paper copy in the mail If you have any lab test that is abnormal or we need to change your treatment, we will call you to review the results.   Testing/Procedures: None ordered   Follow-Up: At [facility], you and your health needs are our priority.  As part of our continuing mission to provide you with exceptional heart care, we have created designated Provider Care Teams.  These Care Teams include your primary Cardiologist (physician) and Advanced Practice Providers (APPs -  Physician Assistants and Nurse Practitioners) who all work together to provide you with the care you need, when you need it.  We recommend signing up for the patient portal called "MyChart".  Sign up information is provided on this After Visit Summary.  MyChart is used to connect with patients for Virtual Visits (Telemedicine).  Patients are able to view lab/test results, encounter notes, upcoming appointments, etc.  Non-urgent messages can be sent to your provider as well.   To learn more about what you can do with MyChart, go to [email or web address].    Your next appointment:   12 month(s)  The format for your next appointment:   In Person  Provider:    You may see Dr. Garen Lah or one of the following Advanced Practice Providers on your designated Care Team:    Murray Hodgkins, NP  Christell Faith, PA-C  Marrianne Mood, PA-C    Other Instructions N/A     Signed, Kate Sable, MD  01/01/2020 4:32 PM    [place]

## 2020-01-02 ENCOUNTER — Encounter: Payer: Self-pay | Admitting: Family Medicine

## 2020-01-04 ENCOUNTER — Other Ambulatory Visit: Payer: Self-pay | Admitting: Family Medicine

## 2020-01-04 MED ORDER — BACLOFEN 20 MG PO TABS: 20.0000 mg | ORAL_TABLET | Freq: Three times a day (TID) | ORAL | 0 refills | Status: AC

## 2020-01-04 MED ORDER — BACLOFEN 20 MG PO TABS: 20.0000 mg | ORAL_TABLET | Freq: Three times a day (TID) | ORAL | 2 refills | Status: DC

## 2020-01-08 ENCOUNTER — Telehealth: Payer: Self-pay | Admitting: Family Medicine

## 2020-01-08 NOTE — Telephone Encounter (Signed)
See my chart message

## 2020-01-08 NOTE — Telephone Encounter (Signed)
Copied from CRM [id number]. Topic: General - Other >> Jan 08, 2020  1:18 PM Laural Benes, [place] C wrote: Reason for CRM: pt called in to follow up on my chart message. Pt says that she need for the office to reach out to her pharmacy to okay refill. Pt asked if someone could let her know when this has been completed?

## 2020-01-12 ENCOUNTER — Ambulatory Visit: Payer: Medicaid Other | Attending: Internal Medicine

## 2020-01-12 DIAGNOSIS — Z23 Encounter for immunization: Secondary | ICD-10-CM

## 2020-01-12 NOTE — Progress Notes (Signed)
   Covid-19 Vaccination Clinic  Name:  Madeline White    MRN: [id number] DOB: Sep 13, 1986  01/12/2020  [name] was observed post Covid-19 immunization for 15 minutes without incident. She was provided with Vaccine Information Sheet and instruction to access the V-Safe system.   [name] was instructed to call 911 with any severe reactions post vaccine: Marland Kitchen Difficulty breathing  . Swelling of face and throat  . A fast heartbeat  . A bad rash all over body  . Dizziness and weakness   Immunizations Administered    Name Date Dose VIS Date Route   Pfizer COVID-19 Vaccine 01/12/2020  8:09 AM 0.3 mL 11/15/2018 Intramuscular   Manufacturer: [employer], [employer]   Lot: [id number]   NDC: [address]-3

## 2020-01-16 ENCOUNTER — Ambulatory Visit: Payer: Self-pay

## 2022-02-24 ENCOUNTER — Telehealth: Payer: Self-pay | Admitting: Cardiology

## 2022-02-24 NOTE — Telephone Encounter (Signed)
We have attempted to schedule several times with no success Deleting recall
